# Patient Record
Sex: Female | Born: 1950 | Race: White | Hispanic: No | Smoking: Never smoker
Health system: Southern US, Community
[De-identification: ages and names within clinical notes are randomized; demographics above are authoritative.]

## PROBLEM LIST (undated history)

## (undated) DIAGNOSIS — I10 Essential (primary) hypertension: Secondary | ICD-10-CM

## (undated) DIAGNOSIS — IMO0002 Reserved for concepts with insufficient information to code with codable children: Secondary | ICD-10-CM

## (undated) HISTORY — DX: Reserved for concepts with insufficient information to code with codable children: IMO0002

## (undated) HISTORY — DX: Essential (primary) hypertension: I10

---

## 1999-05-20 ENCOUNTER — Encounter: Admission: RE | Admit: 1999-05-20 | Discharge: 1999-05-20 | Payer: Self-pay | Admitting: Obstetrics and Gynecology

## 1999-05-20 ENCOUNTER — Encounter: Payer: Self-pay | Admitting: Obstetrics and Gynecology

## 2000-05-22 ENCOUNTER — Encounter: Payer: Self-pay | Admitting: Obstetrics and Gynecology

## 2000-05-22 ENCOUNTER — Encounter: Admission: RE | Admit: 2000-05-22 | Discharge: 2000-05-22 | Payer: Self-pay | Admitting: Obstetrics and Gynecology

## 2001-05-27 ENCOUNTER — Encounter: Admission: RE | Admit: 2001-05-27 | Discharge: 2001-05-27 | Payer: Self-pay | Admitting: Obstetrics and Gynecology

## 2001-05-27 ENCOUNTER — Encounter: Payer: Self-pay | Admitting: Obstetrics and Gynecology

## 2002-11-12 ENCOUNTER — Encounter: Admission: RE | Admit: 2002-11-12 | Discharge: 2002-11-12 | Payer: Self-pay | Admitting: Obstetrics and Gynecology

## 2002-11-12 ENCOUNTER — Encounter: Payer: Self-pay | Admitting: Obstetrics and Gynecology

## 2004-01-07 ENCOUNTER — Encounter: Admission: RE | Admit: 2004-01-07 | Discharge: 2004-01-07 | Payer: Self-pay | Admitting: Obstetrics and Gynecology

## 2009-10-15 ENCOUNTER — Ambulatory Visit (HOSPITAL_COMMUNITY): Admission: RE | Admit: 2009-10-15 | Discharge: 2009-10-15 | Payer: Self-pay | Admitting: Obstetrics and Gynecology

## 2010-07-03 ENCOUNTER — Encounter: Payer: Self-pay | Admitting: Obstetrics and Gynecology

## 2012-10-07 ENCOUNTER — Encounter: Payer: Self-pay | Admitting: Gynecology

## 2012-10-16 ENCOUNTER — Encounter: Payer: Self-pay | Admitting: Gynecology

## 2012-10-16 ENCOUNTER — Ambulatory Visit (INDEPENDENT_AMBULATORY_CARE_PROVIDER_SITE_OTHER): Payer: BC Managed Care – PPO | Admitting: Gynecology

## 2012-10-16 VITALS — BP 138/80 | Ht 62.0 in | Wt 146.0 lb

## 2012-10-16 DIAGNOSIS — Z01419 Encounter for gynecological examination (general) (routine) without abnormal findings: Secondary | ICD-10-CM

## 2012-10-16 DIAGNOSIS — N8111 Cystocele, midline: Secondary | ICD-10-CM

## 2012-10-16 NOTE — Progress Notes (Signed)
Kristin Romero 1950/10/17 [id number]        62 y.o.  G3P3003 for annual exam.  Former patient of Dr. Elana Alm.  Past medical history,surgical history, medications, allergies, family history and social history were all reviewed and documented in the EPIC chart. ROS:  Was performed and pertinent positives and negatives are included in the history.  Exam: [name] assistant Filed Vitals:   10/16/12 1135  BP: 138/80  Height: 5\' 2"  (1.575 m)  Weight: 146 lb (66.225 kg)   General appearance  Normal Skin grossly normal Head/Neck normal with no cervical or supraclavicular adenopathy thyroid normal Lungs  clear Cardiac RR, without RMG Abdominal  soft, nontender, without masses, organomegaly or hernia Breasts  examined lying and sitting without masses, retractions, discharge or axillary adenopathy. Pelvic  Ext/BUS/vagina  generalized atrophic changes. Mild cystocele noted  Cervix  normal with atrophic changes  Uterus  axial, normal size, shape and contour, midline and mobile nontender   Adnexa  Without masses or tenderness    Anus and perineum  normal   Rectovaginal  normal sphincter tone without palpated masses or tenderness.    Assessment/Plan:  62 y.o. G44P3003 female for annual exam.   1. Postmenopausal. Without significant symptoms such as hot flushes, night sweats vaginal dryness dyspareunia. We'll continue to monitor. Patient does report any bleeding. 2. Mild cystocele. First degree, asymptomatic. Reviewed with patient. She'll monitor for symptoms but otherwise will follow expectantly. Having no urinary symptoms such as incontinence or urgency. 3. Mammography 09/2012. Continued annual mammography. 4. Pap smear 2012. No Pap smear done today. No history of abnormal Pap smears with regular Pap annual smears historically. Plan repeat next year a 3 year interval. 5. Colonoscopy 4 years ago. Plan repeat next year as she is on the 5 year interval. 6. DEXA never. Plan DEXA age 64 as she is low  risk historically. Increased calcium vitamin D reviewed. 7. Health maintenance. Recently saw Dr. Kevan Ny for her annual and followup of her hypertension. No blood work done as it is all done through his office. Followup one year, sooner as needed.    [name], 12:10 PM 10/16/2012

## 2012-10-16 NOTE — Patient Instructions (Signed)
Follow up in one year for annual exam 

## 2012-10-17 LAB — URINALYSIS W MICROSCOPIC + REFLEX CULTURE
Bacteria, UA: NONE SEEN
Nitrite: NEGATIVE
Protein, ur: NEGATIVE mg/dL
Urobilinogen, UA: 0.2 mg/dL (ref 0.0–1.0)

## 2012-10-18 ENCOUNTER — Other Ambulatory Visit: Payer: Self-pay | Admitting: Gynecology

## 2012-10-18 MED ORDER — SULFAMETHOXAZOLE-TMP DS 800-160 MG PO TABS: 1.0000 | ORAL_TABLET | Freq: Two times a day (BID) | ORAL | Status: DC

## 2012-10-19 LAB — URINE CULTURE

## 2013-05-15 ENCOUNTER — Emergency Department (HOSPITAL_COMMUNITY)
Admission: EM | Admit: 2013-05-15 | Discharge: 2013-05-15 | Disposition: A | Discharge status: Home / Self Care | Payer: BC Managed Care – PPO | Attending: Emergency Medicine | Admitting: Emergency Medicine

## 2013-05-15 ENCOUNTER — Emergency Department (HOSPITAL_COMMUNITY): Payer: BC Managed Care – PPO

## 2013-05-15 ENCOUNTER — Encounter (HOSPITAL_COMMUNITY): Payer: Self-pay | Admitting: Emergency Medicine

## 2013-05-15 DIAGNOSIS — S43016A Anterior dislocation of unspecified humerus, initial encounter: Secondary | ICD-10-CM | POA: Insufficient documentation

## 2013-05-15 DIAGNOSIS — Y929 Unspecified place or not applicable: Secondary | ICD-10-CM | POA: Insufficient documentation

## 2013-05-15 DIAGNOSIS — IMO0002 Reserved for concepts with insufficient information to code with codable children: Secondary | ICD-10-CM | POA: Insufficient documentation

## 2013-05-15 DIAGNOSIS — I10 Essential (primary) hypertension: Secondary | ICD-10-CM | POA: Insufficient documentation

## 2013-05-15 DIAGNOSIS — W010XXA Fall on same level from slipping, tripping and stumbling without subsequent striking against object, initial encounter: Secondary | ICD-10-CM | POA: Insufficient documentation

## 2013-05-15 DIAGNOSIS — S01501A Unspecified open wound of lip, initial encounter: Secondary | ICD-10-CM | POA: Insufficient documentation

## 2013-05-15 DIAGNOSIS — W19XXXA Unspecified fall, initial encounter: Secondary | ICD-10-CM

## 2013-05-15 DIAGNOSIS — K0381 Cracked tooth: Secondary | ICD-10-CM | POA: Insufficient documentation

## 2013-05-15 DIAGNOSIS — Z79899 Other long term (current) drug therapy: Secondary | ICD-10-CM | POA: Insufficient documentation

## 2013-05-15 DIAGNOSIS — S43004A Unspecified dislocation of right shoulder joint, initial encounter: Secondary | ICD-10-CM

## 2013-05-15 DIAGNOSIS — Y9389 Activity, other specified: Secondary | ICD-10-CM | POA: Insufficient documentation

## 2013-05-15 DIAGNOSIS — S43036A Inferior dislocation of unspecified humerus, initial encounter: Secondary | ICD-10-CM | POA: Insufficient documentation

## 2013-05-15 MED ORDER — DIAZEPAM 5 MG/ML IJ SOLN
5.0000 mg | Freq: Once | INTRAMUSCULAR | Status: AC
  Administered 2013-05-15: 5 mg via INTRAVENOUS
  Filled 2013-05-15: qty 2

## 2013-05-15 MED ORDER — FENTANYL CITRATE 0.05 MG/ML IJ SOLN
100.0000 ug | Freq: Once | INTRAMUSCULAR | Status: AC
  Administered 2013-05-15: 100 ug via INTRAVENOUS
  Filled 2013-05-15: qty 2

## 2013-05-15 MED ORDER — HYDROMORPHONE HCL PF 1 MG/ML IJ SOLN
1.0000 mg | Freq: Once | INTRAMUSCULAR | Status: AC
  Administered 2013-05-15: 1 mg via INTRAMUSCULAR
  Filled 2013-05-15: qty 1

## 2013-05-15 MED ORDER — OXYCODONE-ACETAMINOPHEN 5-325 MG PO TABS: 1.0000 | ORAL_TABLET | ORAL | Status: DC | PRN

## 2013-05-15 MED ORDER — SODIUM CHLORIDE 0.9 % IV BOLUS (SEPSIS)
1000.0000 mL | Freq: Once | INTRAVENOUS | Status: AC
  Administered 2013-05-15: 1000 mL via INTRAVENOUS

## 2013-05-15 NOTE — ED Notes (Signed)
PT ambulated with baseline gait; VSS; A&Ox3; no signs of distress; respirations even and unlabored; skin warm and dry; no questions upon discharge.  

## 2013-05-15 NOTE — ED Notes (Signed)
PA manipulating shoulder at bedside.Pt's VSS; no signs of distress; family at bedside.

## 2013-05-15 NOTE — ED Notes (Signed)
PA at bedside.

## 2013-05-15 NOTE — ED Notes (Signed)
MD at bedside. 

## 2013-05-15 NOTE — ED Notes (Signed)
Pain has improved from 10 to 7; pt taken to xray.

## 2013-05-15 NOTE — ED Provider Notes (Signed)
CSN: [id number]     Arrival date & time 05/15/13  1633 History  This chart was scribed for non-physician practitioner Dierdre Forth, PA, working with Raeford Razor, MD by Ronal Fear, ED scribe. This patient was seen in room TR11C/TR11C and the patient's care was started at 6:08PM.    Chief Complaint  Patient presents with  . Fall   (Consider location/radiation/quality/duration/timing/severity/associated sxs/prior Treatment) Patient is a 62 y.o. female presenting with fall. The history is provided by the patient. No language interpreter was used.  Fall This is a new problem. The current episode started 1 to 2 hours ago. The problem occurs rarely. The problem has been gradually improving. Pertinent negatives include no chest pain, no abdominal pain, no headaches and no shortness of breath. Nothing relieves the symptoms. She has tried nothing for the symptoms.  HPI Comments: Kristin Romero is a 62 y.o. female who presents to the Emergency Department complaining of a mechanical sudden fall with 6/10 pain after leaving a store and tripping over her own feet. She reports she fell landing on her right shoulder and then her face.  She has an abrasion to her face and chipped teeth. Pt states that her right arm feels tingly and swollen. Pt denies injury to the head or LOC, neck or back pain. Pt does not appear to be in any acute distress with no other complaints.  She has not attempted any OTC pain medication for the pain.    Past Medical History  Diagnosis Date  . Hypertension    History reviewed. No pertinent past surgical history. Family History  Problem Relation Age of Onset  . Lung cancer Mother   . Colon cancer Father   . Pancreatic cancer Paternal Grandfather    History  Substance Use Topics  . Smoking status: Never Smoker   . Smokeless tobacco: Not on file  . Alcohol Use: 3.0 oz/week    6 drink(s) per week   OB History   Grav Para Term Preterm Abortions TAB SAB Ect Mult  Living   3 3 3       3      Review of Systems  Constitutional: Negative for fever and chills.  Respiratory: Negative for shortness of breath.   Cardiovascular: Negative for chest pain.  Gastrointestinal: Negative for nausea, vomiting and abdominal pain.  Musculoskeletal: Positive for arthralgias, joint swelling and myalgias. Negative for back pain, neck pain and neck stiffness.  Skin: Positive for wound.  Neurological: Negative for syncope, weakness, numbness and headaches.  Hematological: Does not bruise/bleed easily.  Psychiatric/Behavioral: The patient is not nervous/anxious.   All other systems reviewed and are negative.    Allergies  Review of patient's allergies indicates no known allergies.  Home Medications   Current Outpatient Rx  Name  Route  Sig  Dispense  Refill  . amLODipine (NORVASC) 10 MG tablet   Oral   Take 5 mg by mouth daily.         Marland Kitchen escitalopram (LEXAPRO) 10 MG tablet   Oral   Take 5 mg by mouth daily.         . Multiple Vitamins-Minerals (CENTRUM PO)   Oral   Take 1 tablet by mouth daily.         . valsartan-hydrochlorothiazide (DIOVAN-HCT) 80-12.5 MG per tablet   Oral   Take 0.5 tablets by mouth daily.          Marland Kitchen oxyCODONE-acetaminophen (PERCOCET/ROXICET) 5-325 MG per tablet   Oral   Take 1-2  tablets by mouth every 4 (four) hours as needed for severe pain.   15 tablet   0    BP 134/88  Pulse 99  Temp(Src) 97.8 F (36.6 C) (Oral)  Resp 18  SpO2 97% Physical Exam  Nursing note and vitals reviewed. Constitutional: She is oriented to person, place, and time. She appears well-developed and well-nourished. She appears distressed.  Pt crying  HENT:  Head: Normocephalic.  Right Ear: Tympanic membrane and external ear normal.  Left Ear: Tympanic membrane, external ear and ear canal normal.  Nose: Nose normal. No mucosal edema.  Mouth/Throat: Uvula is midline and oropharynx is clear and moist. Lacerations present. No uvula swelling.  No oropharyngeal exudate, posterior oropharyngeal edema, posterior oropharyngeal erythema or tonsillar abscesses.  Small < 1 cm laceration to the mucosal surface of the center upper lip Abrasion to the center upper lip  Eyes: Conjunctivae and EOM are normal. Pupils are equal, round, and reactive to light.  Neck: Normal range of motion. Neck supple.  Cardiovascular: Normal rate, regular rhythm, normal heart sounds and intact distal pulses.   No murmur heard. Capillary refill less than 3 seconds  Pulmonary/Chest: Effort normal and breath sounds normal. No respiratory distress. She has no wheezes.  Abdominal: Soft. Bowel sounds are normal. She exhibits no distension.  Musculoskeletal: She exhibits tenderness. She exhibits no edema.       Right shoulder: She exhibits decreased range of motion, tenderness, swelling, deformity, pain, spasm and decreased strength (isolated to the right shoulder). She exhibits no bony tenderness, no effusion, no crepitus, no laceration and normal pulse.       Right elbow: She exhibits normal range of motion, no swelling, no effusion, no deformity and no laceration. No tenderness found.       Right wrist: Normal. She exhibits normal range of motion, no tenderness, no bony tenderness, no swelling, no effusion, no crepitus, no deformity and no laceration.       Right hand: She exhibits normal range of motion, no tenderness, no bony tenderness, normal two-point discrimination, normal capillary refill, no deformity, no laceration and no swelling. Normal sensation noted. Normal strength noted.  ROM: decreased ROM of the right shoulder 2/2 palpable dislocation; Full ROM of all other joints on the RUE and all other major joints  Lymphadenopathy:    She has no cervical adenopathy.  Neurological: She is alert and oriented to person, place, and time. No cranial nerve deficit. She exhibits normal muscle tone. Coordination normal.  Speech is clear and goal oriented, follows  commands Major Cranial nerves without deficit, no facial droop Normal strength in left upper and bilateral lower extremities including dorsiflexion and plantar flexion, strong and equal grip strength Sensation normal to light and sharp touch in all extremities Moves extremities without ataxia, coordination intact    Skin: Skin is warm and dry. No rash noted. She is not diaphoretic. No erythema.  No tenting of the skin Small abrasions to the left knee, left ankle and dorsum of the left foot  Psychiatric: She has a normal mood and affect.    ED Course  Reduction of dislocation Date/Time: 05/15/2013 7:12 PM Performed by: Dierdre Forth Authorized by: Dierdre Forth Consent: Verbal consent obtained. Risks and benefits: risks, benefits and alternatives were discussed Consent given by: patient Patient understanding: patient states understanding of the procedure being performed Patient consent: the patient's understanding of the procedure matches consent given Procedure consent: procedure consent matches procedure scheduled Relevant documents: relevant documents present and verified Site  marked: the operative site was marked Imaging studies: imaging studies available Required items: required blood products, implants, devices, and special equipment available Patient identity confirmed: arm band and verbally with patient Time out: Immediately prior to procedure a "time out" was called to verify the correct patient, procedure, equipment, support staff and site/side marked as required. Preparation: Patient was prepped and draped in the usual sterile fashion. Local anesthesia used: no Patient sedated: yes Sedation type: anxiolysis Sedatives: diazepam Analgesia: fentanyl Sedation start date/time: 05/15/2013 6:45 PM Sedation end date/time: 05/15/2013 7:12 PM Vitals: Vital signs were monitored during sedation. Patient tolerance: Patient tolerated the procedure well with no immediate  complications. Comments: Right shoulder reduced without complication and with full ROM afterward   (including critical care time)  DIAGNOSTIC STUDIES: Oxygen Saturation is 96% on RA, normal by my interpretation.    COORDINATION OF CARE: 6:13 PM- Pt advised of plan for treatment including resetting the shoulder and conscious sedation and pt agrees.  Labs Review Labs Reviewed - No data to display Imaging Review Dg Shoulder Right  05/15/2013   CLINICAL DATA:  Fall.  Right shoulder injury and pain.  EXAM: RIGHT SHOULDER - 2+ VIEW  COMPARISON:  None.  FINDINGS: Anterior inferior dislocation of the humeral head is seen. No evidence of fracture. Mild degenerative spurring of the acromion process and distal clavicle noted.  IMPRESSION: Anterior-inferior dislocation of the shoulder. No fracture identified.   Electronically Signed   By: Myles Rosenthal M.D.   On: 05/15/2013 18:01   Dg Shoulder Right Port  05/15/2013   CLINICAL DATA:  History of dislocation status post reduction.  EXAM: PORTABLE RIGHT SHOULDER - 2+ VIEW  COMPARISON:  None.  FINDINGS: The previously noted dislocation has been reduced. There is no dislocation on identified on the current films. There is no fracture. The visualized right ribs and right lung are normal.  IMPRESSION: Previously noted dislocation has been reduced. There is no fracture.   Electronically Signed   By: Sherian Rein M.D.   On: 05/15/2013 19:47   Dg Humerus Right  05/15/2013   CLINICAL DATA:  Fall.  Right humerus injury and pain.  EXAM: RIGHT HUMERUS - 2+ VIEW  COMPARISON:  None.  FINDINGS: No evidence of humerus fracture. Anterior -inferior dislocation of the right shoulder is seen.  IMPRESSION: Anterioranterior-inferior dislocation of the right shoulder joint. No humerus fracture identified.   Electronically Signed   By: Myles Rosenthal M.D.   On: 05/15/2013 18:00    EKG Interpretation   None       MDM   1. Fall, initial encounter   2. Shoulder dislocation,  right, initial encounter      Kristin Romero presents with right shoulder dislocation after mechanical fall.  I personally reviewed the imaging tests through PACS system.  I reviewed available ER/hospitalization records through the EMR.  Pt is not taking an anticoagulant.  Will give mild sedation and attempt reduction. Pt is neurovascularly intact and hemodynamically stable. Patient without LOC.  Doubt subdural hematoma.  Patient without neck or back pain, full range of motion of the neck without difficulty, no midline tenderness and no step-offs or deformities; highly doubt cervical spine fracture.      7:20 PM Pt given pain control and and anxiolytics prior to the procedure and shoulder was relocated without complication and with significant pain relief.  Pt is neurovascularly intact after the reduction.  She has full range of motion of the right shoulder after reduction. She is hemodynamically  stable and ambulates without difficulty.  Right shoulder placed in a sling and patient discharged in stable condition.  It has been determined that no acute conditions requiring further emergency intervention are present at this time. The patient/guardian have been advised of the diagnosis and plan. We have discussed signs and symptoms that warrant return to the ED, such as changes or worsening in symptoms.   Vital signs are stable at discharge.   BP 134/88  Pulse 99  Temp(Src) 97.8 F (36.6 C) (Oral)  Resp 18  SpO2 97%  Patient/guardian has voiced understanding and agreed to follow-up with the PCP or specialist.      Dierdre Forth, PA-C 05/15/13 2026

## 2013-05-15 NOTE — ED Notes (Signed)
Pt reports her shoulder feels a lot better.

## 2013-05-15 NOTE — ED Notes (Signed)
Pt tripped and fell onto R arm and mouth just pta. C/o severe upper arm pain now. Abrasion to top lip with no active bleeding, no loose teeth but front teeth are chipped. Pt denies LOC. Ambulatory, A&Ox4

## 2013-05-21 NOTE — ED Provider Notes (Signed)
Medical screening examination/treatment/procedure(s) were conducted as a shared visit with non-physician practitioner(s) and myself.  I personally evaluated the patient during the encounter.  EKG Interpretation   None      62 year old female with right shoulder pain after a fall. Imaging significant for an anterior shoulder dislocation. Reduced by Dahlia Client after anxiolysis and dose of opiate. Symptoms much improved. Postreduction films confirm reduction. Sling, PRN pain meds and ortho FU. Additionally, facial abrasions and chipped incisor. Wound care and dental fu.   Raeford Razor, MD 05/21/13 [phone]

## 2014-03-02 DIAGNOSIS — H52229 Regular astigmatism, unspecified eye: Secondary | ICD-10-CM | POA: Diagnosis not present

## 2014-03-02 DIAGNOSIS — H2510 Age-related nuclear cataract, unspecified eye: Secondary | ICD-10-CM | POA: Diagnosis not present

## 2014-03-02 DIAGNOSIS — H5210 Myopia, unspecified eye: Secondary | ICD-10-CM | POA: Diagnosis not present

## 2014-04-13 ENCOUNTER — Encounter (HOSPITAL_COMMUNITY): Payer: Self-pay | Admitting: Emergency Medicine

## 2014-09-11 DIAGNOSIS — IMO0002 Reserved for concepts with insufficient information to code with codable children: Secondary | ICD-10-CM

## 2014-09-11 HISTORY — DX: Reserved for concepts with insufficient information to code with codable children: IMO0002

## 2014-09-17 DIAGNOSIS — Z1231 Encounter for screening mammogram for malignant neoplasm of breast: Secondary | ICD-10-CM | POA: Diagnosis not present

## 2014-09-21 ENCOUNTER — Encounter: Payer: Self-pay | Admitting: Gynecology

## 2014-09-24 ENCOUNTER — Encounter: Payer: Self-pay | Admitting: Gynecology

## 2014-09-24 ENCOUNTER — Other Ambulatory Visit (HOSPITAL_COMMUNITY)
Admission: RE | Admit: 2014-09-24 | Discharge: 2014-09-24 | Disposition: A | Discharge status: Home / Self Care | Payer: Medicare Other | Source: Ambulatory Visit | Attending: Gynecology | Admitting: Gynecology

## 2014-09-24 ENCOUNTER — Ambulatory Visit (INDEPENDENT_AMBULATORY_CARE_PROVIDER_SITE_OTHER): Payer: Medicare Other | Admitting: Gynecology

## 2014-09-24 VITALS — BP 122/76 | Ht 62.0 in | Wt 153.0 lb

## 2014-09-24 DIAGNOSIS — Z01419 Encounter for gynecological examination (general) (routine) without abnormal findings: Secondary | ICD-10-CM | POA: Diagnosis not present

## 2014-09-24 DIAGNOSIS — R87619 Unspecified abnormal cytological findings in specimens from cervix uteri: Secondary | ICD-10-CM | POA: Insufficient documentation

## 2014-09-24 DIAGNOSIS — N952 Postmenopausal atrophic vaginitis: Secondary | ICD-10-CM

## 2014-09-24 DIAGNOSIS — Z1151 Encounter for screening for human papillomavirus (HPV): Secondary | ICD-10-CM | POA: Diagnosis not present

## 2014-09-24 DIAGNOSIS — Z124 Encounter for screening for malignant neoplasm of cervix: Secondary | ICD-10-CM | POA: Diagnosis not present

## 2014-09-24 NOTE — Addendum Note (Signed)
Addended by: Nelva Nay on: 09/24/2014 12:01 PM   Modules accepted: Orders

## 2014-09-24 NOTE — Patient Instructions (Signed)
Follow up for bone density as scheduled.  You may obtain a copy of any labs that were done today by logging onto MyChart as outlined in the instructions provided with your AVS (after visit summary). The office will not call with normal lab results but certainly if there are any significant abnormalities then we will contact you.   Health Maintenance, Female A healthy lifestyle and preventative care can promote health and wellness.  Maintain regular health, dental, and eye exams.  Eat a healthy diet. Foods like vegetables, fruits, whole grains, low-fat dairy products, and lean protein foods contain the nutrients you need without too many calories. Decrease your intake of foods high in solid fats, added sugars, and salt. Get information about a proper diet from your caregiver, if necessary.  Regular physical exercise is one of the most important things you can do for your health. Most adults should get at least 150 minutes of moderate-intensity exercise (any activity that increases your heart rate and causes you to sweat) each week. In addition, most adults need muscle-strengthening exercises on 2 or more days a week.   Maintain a healthy weight. The body mass index (BMI) is a screening tool to identify possible weight problems. It provides an estimate of body fat based on height and weight. Your caregiver can help determine your BMI, and can help you achieve or maintain a healthy weight. For adults 20 years and older:  A BMI below 18.5 is considered underweight.  A BMI of 18.5 to 24.9 is normal.  A BMI of 25 to 29.9 is considered overweight.  A BMI of 30 and above is considered obese.  Maintain normal blood lipids and cholesterol by exercising and minimizing your intake of saturated fat. Eat a balanced diet with plenty of fruits and vegetables. Blood tests for lipids and cholesterol should begin at age 20 and be repeated every 5 years. If your lipid or cholesterol levels are high, you are over  50, or you are a high risk for heart disease, you may need your cholesterol levels checked more frequently.Ongoing high lipid and cholesterol levels should be treated with medicines if diet and exercise are not effective.  If you smoke, find out from your caregiver how to quit. If you do not use tobacco, do not start.  Lung cancer screening is recommended for adults aged 55 80 years who are at high risk for developing lung cancer because of a history of smoking. Yearly low-dose computed tomography (CT) is recommended for people who have at least a 30-pack-year history of smoking and are a current smoker or have quit within the past 15 years. A pack year of smoking is smoking an average of 1 pack of cigarettes a day for 1 year (for example: 1 pack a day for 30 years or 2 packs a day for 15 years). Yearly screening should continue until the smoker has stopped smoking for at least 15 years. Yearly screening should also be stopped for people who develop a health problem that would prevent them from having lung cancer treatment.  If you are pregnant, do not drink alcohol. If you are breastfeeding, be very cautious about drinking alcohol. If you are not pregnant and choose to drink alcohol, do not exceed 1 drink per day. One drink is considered to be 12 ounces (355 mL) of beer, 5 ounces (148 mL) of wine, or 1.5 ounces (44 mL) of liquor.  Avoid use of street drugs. Do not share needles with anyone. Ask for help   if you need support or instructions about stopping the use of drugs.  High blood pressure causes heart disease and increases the risk of stroke. Blood pressure should be checked at least every 1 to 2 years. Ongoing high blood pressure should be treated with medicines, if weight loss and exercise are not effective.  If you are 55 to 66 years old, ask your caregiver if you should take aspirin to prevent strokes.  Diabetes screening involves taking a blood sample to check your fasting blood sugar level.  This should be done once every 3 years, after age 45, if you are within normal weight and without risk factors for diabetes. Testing should be considered at a younger age or be carried out more frequently if you are overweight and have at least 1 risk factor for diabetes.  Breast cancer screening is essential preventative care for women. You should practice "breast self-awareness." This means understanding the normal appearance and feel of your breasts and may include breast self-examination. Any changes detected, no matter how small, should be reported to a caregiver. Women in their 20s and 30s should have a clinical breast exam (CBE) by a caregiver as part of a regular health exam every 1 to 3 years. After age 40, women should have a CBE every year. Starting at age 40, women should consider having a mammogram (breast X-ray) every year. Women who have a family history of breast cancer should talk to their caregiver about genetic screening. Women at a high risk of breast cancer should talk to their caregiver about having an MRI and a mammogram every year.  Breast cancer gene (BRCA)-related cancer risk assessment is recommended for women who have family members with BRCA-related cancers. BRCA-related cancers include breast, ovarian, tubal, and peritoneal cancers. Having family members with these cancers may be associated with an increased risk for harmful changes (mutations) in the breast cancer genes BRCA1 and BRCA2. Results of the assessment will determine the need for genetic counseling and BRCA1 and BRCA2 testing.  The Pap test is a screening test for cervical cancer. Women should have a Pap test starting at age 21. Between ages 21 and 29, Pap tests should be repeated every 2 years. Beginning at age 30, you should have a Pap test every 3 years as long as the past 3 Pap tests have been normal. If you had a hysterectomy for a problem that was not cancer or a condition that could lead to cancer, then you no  longer need Pap tests. If you are between ages 65 and 70, and you have had normal Pap tests going back 10 years, you no longer need Pap tests. If you have had past treatment for cervical cancer or a condition that could lead to cancer, you need Pap tests and screening for cancer for at least 20 years after your treatment. If Pap tests have been discontinued, risk factors (such as a new sexual partner) need to be reassessed to determine if screening should be resumed. Some women have medical problems that increase the chance of getting cervical cancer. In these cases, your caregiver may recommend more frequent screening and Pap tests.  The human papillomavirus (HPV) test is an additional test that may be used for cervical cancer screening. The HPV test looks for the virus that can cause the cell changes on the cervix. The cells collected during the Pap test can be tested for HPV. The HPV test could be used to screen women aged 30 years and older, and   should be used in women of any age who have unclear Pap test results. After the age of 30, women should have HPV testing at the same frequency as a Pap test.  Colorectal cancer can be detected and often prevented. Most routine colorectal cancer screening begins at the age of 50 and continues through age 75. However, your caregiver may recommend screening at an earlier age if you have risk factors for colon cancer. On a yearly basis, your caregiver may provide home test kits to check for hidden blood in the stool. Use of a small camera at the end of a tube, to directly examine the colon (sigmoidoscopy or colonoscopy), can detect the earliest forms of colorectal cancer. Talk to your caregiver about this at age 50, when routine screening begins. Direct examination of the colon should be repeated every 5 to 10 years through age 75, unless early forms of pre-cancerous polyps or small growths are found.  Hepatitis C blood testing is recommended for all people born from  1945 through 1965 and any individual with known risks for hepatitis C.  Practice safe sex. Use condoms and avoid high-risk sexual practices to reduce the spread of sexually transmitted infections (STIs). Sexually active women aged 25 and younger should be checked for Chlamydia, which is a common sexually transmitted infection. Older women with new or multiple partners should also be tested for Chlamydia. Testing for other STIs is recommended if you are sexually active and at increased risk.  Osteoporosis is a disease in which the bones lose minerals and strength with aging. This can result in serious bone fractures. The risk of osteoporosis can be identified using a bone density scan. Women ages 65 and over and women at risk for fractures or osteoporosis should discuss screening with their caregivers. Ask your caregiver whether you should be taking a calcium supplement or vitamin D to reduce the rate of osteoporosis.  Menopause can be associated with physical symptoms and risks. Hormone replacement therapy is available to decrease symptoms and risks. You should talk to your caregiver about whether hormone replacement therapy is right for you.  Use sunscreen. Apply sunscreen liberally and repeatedly throughout the day. You should seek shade when your shadow is shorter than you. Protect yourself by wearing long sleeves, pants, a wide-brimmed hat, and sunglasses year round, whenever you are outdoors.  Notify your caregiver of new moles or changes in moles, especially if there is a change in shape or color. Also notify your caregiver if a mole is larger than the size of a pencil eraser.  Stay current with your immunizations. Document Released: 12/12/2010 Document Revised: 09/23/2012 Document Reviewed: 12/12/2010 [employer] Patient Information 2014 [employer].   

## 2014-09-24 NOTE — Progress Notes (Signed)
Kristin Romero [birth year] [id number]        64 y.o.  [id number] for breast and pelvic exam.  Past medical history,surgical history, problem list, medications, allergies, family history and social history were all reviewed and documented as reviewed in the EPIC chart.  ROS:  Performed with pertinent positives and negatives included in the history, assessment and plan.   Additional significant findings :  none   Exam: Kristin Romero Vitals:   09/24/14 1124  BP: 122/76  Height: 5\' 2"  (1.575 m)  Weight: 153 lb (69.4 kg)   General appearance:  Normal affect, orientation and appearance. Skin: Grossly normal HEENT: Without gross lesions.  No cervical or supraclavicular adenopathy. Thyroid normal.  Lungs:  Clear without wheezing, rales or rhonchi Cardiac: RR, without RMG Abdominal:  Soft, nontender, without masses, guarding, rebound, organomegaly or hernia Breasts:  Examined lying and sitting without masses, retractions, discharge or axillary adenopathy. Pelvic:  Ext/BUS/vagina with atrophic changes  Cervix with atrophic changes. Pap smear done  Uterus anteverted, normal size, shape and contour, midline and mobile nontender   Adnexa  Without masses or tenderness    Anus and perineum  Normal   Rectovaginal  Normal sphincter tone without palpated masses or tenderness.    Assessment/Plan:  64 y.o. [id number] female for breast and pelvic exam.   1. Postmenopausal. Without significant hot flushes, night sweats, vaginal dryness or any vaginal bleeding. Continue to monitor and report any vaginal bleeding or other issues. 2. Pap smear 2012. Pap smear done today. No history of abnormal Pap smears previously. 3. Colonoscopy 2015. Repeat at their recommended interval. 4. Mammography 09/2014. Continue with annual mammography. SBE monthly reviewed. 5. DEXA never. Schedule baseline DEXA now she is 29. Increased calcium vitamin D reviewed. 6. Health maintenance. No routine blood work done as this  is done at her primary physician's office. Follow up for bone density otherwise 1 year, sooner if any issues.     Kristin Auerbach MD, 11:54 AM 09/24/2014

## 2014-09-25 LAB — URINALYSIS W MICROSCOPIC + REFLEX CULTURE
Bilirubin Urine: NEGATIVE
CASTS: NONE SEEN
Crystals: NONE SEEN
Glucose, UA: NEGATIVE mg/dL
Ketones, ur: NEGATIVE mg/dL
LEUKOCYTES UA: NEGATIVE
Nitrite: NEGATIVE
Protein, ur: NEGATIVE mg/dL
Specific Gravity, Urine: 1.025 (ref 1.005–1.030)
Urobilinogen, UA: 0.2 mg/dL (ref 0.0–1.0)
pH: 6 (ref 5.0–8.0)

## 2014-09-25 LAB — CYTOLOGY - PAP

## 2014-09-30 ENCOUNTER — Encounter: Payer: Self-pay | Admitting: Gynecology

## 2015-03-30 DIAGNOSIS — H5213 Myopia, bilateral: Secondary | ICD-10-CM | POA: Diagnosis not present

## 2015-03-30 DIAGNOSIS — H2513 Age-related nuclear cataract, bilateral: Secondary | ICD-10-CM | POA: Diagnosis not present

## 2015-03-30 DIAGNOSIS — H52223 Regular astigmatism, bilateral: Secondary | ICD-10-CM | POA: Diagnosis not present

## 2015-07-22 ENCOUNTER — Ambulatory Visit (INDEPENDENT_AMBULATORY_CARE_PROVIDER_SITE_OTHER): Payer: Medicare Other

## 2015-07-22 ENCOUNTER — Encounter: Payer: Self-pay | Admitting: Sports Medicine

## 2015-07-22 ENCOUNTER — Ambulatory Visit (INDEPENDENT_AMBULATORY_CARE_PROVIDER_SITE_OTHER): Payer: Medicare Other | Admitting: Sports Medicine

## 2015-07-22 VITALS — BP 166/104 | HR 91 | Ht 62.0 in | Wt 156.0 lb

## 2015-07-22 DIAGNOSIS — X58XXXA Exposure to other specified factors, initial encounter: Secondary | ICD-10-CM

## 2015-07-22 DIAGNOSIS — S43014A Anterior dislocation of right humerus, initial encounter: Secondary | ICD-10-CM | POA: Diagnosis not present

## 2015-07-22 DIAGNOSIS — M25511 Pain in right shoulder: Secondary | ICD-10-CM

## 2015-07-22 DIAGNOSIS — S43004A Unspecified dislocation of right shoulder joint, initial encounter: Secondary | ICD-10-CM | POA: Diagnosis not present

## 2015-07-22 DIAGNOSIS — S43081A Other subluxation of right shoulder joint, initial encounter: Secondary | ICD-10-CM | POA: Diagnosis not present

## 2015-07-22 DIAGNOSIS — S43084A Other dislocation of right shoulder joint, initial encounter: Secondary | ICD-10-CM | POA: Diagnosis not present

## 2015-07-22 MED ORDER — HYDROCODONE-ACETAMINOPHEN 5-325 MG PO TABS: 1.0000 | ORAL_TABLET | Freq: Three times a day (TID) | ORAL | Status: DC | PRN

## 2015-07-22 NOTE — Progress Notes (Signed)
   Subjective:    I'm seeing this patient as a consultation for:   Dr. Inda Merlin  CC:  Right shoulder dislocation  HPI: This is a pleasant 65 year old female, she has a history of a right shoulder dislocation 3 years ago, this was reduced in the emergency department, she never had any further follow-up, physical therapy, or advanced imaging. Today she slipped and fell, injuring her right shoulder and she feels as though it is recurrently dislocated  Past medical history, Surgical history, Family history not pertinant except as noted below, Social history, Allergies, and medications have been entered into the medical record, reviewed, and no changes needed.   Review of Systems: No headache, visual changes, nausea, vomiting, diarrhea, constipation, dizziness, abdominal pain, skin rash, fevers, chills, night sweats, weight loss, swollen lymph nodes, body aches, joint swelling, muscle aches, chest pain, shortness of breath, mood changes, visual or auditory hallucinations.   Objective:   General: Well Developed, well nourished, and in no acute distress.  Neuro/Psych: Alert and oriented x3, extra-ocular muscles intact, able to move all 4 extremities, sensation grossly intact. Skin: Warm and dry, no rashes noted.  Respiratory: Not using accessory muscles, speaking in full sentences, trachea midline.  Cardiovascular: Pulses palpable, no extremity edema. Abdomen: Does not appear distended. Right shoulder: Held adducted and internally rotated, good sensation in the axillary distribution, good radial and ulnar artery pulses. No wrist drop.  Procedure:  Right glenohumeral dislocation reduction  Risks, benefits, and alternatives explained and consent obtained. Time out conducted. Surface prepped with alcohol. Using a spinal needle advanced into the glenohumeral capsule, I injected 5 mL lidocaine, 5 mL Marcaine. Adequate anesthesia ensured. Reduction: Using gentle axial traction and slow abduction and  external rotation of his able to reduce the humeral head into the glenoid socket. Good sensation and pulses post reduction. Post reduction films obtained showed anatomic alignment. Pt stable, aftercare and follow-up advised.  Impression and Recommendations:   This case required medical decision making of moderate complexity.

## 2015-07-22 NOTE — Assessment & Plan Note (Signed)
Closed reduction as above. Sling,  Hydrocodone, we will start formal physical therapy in a month. This is her second dislocation, ultimately she will need an MR arthrogram

## 2015-07-23 ENCOUNTER — Ambulatory Visit (INDEPENDENT_AMBULATORY_CARE_PROVIDER_SITE_OTHER): Payer: Medicare Other | Admitting: Sports Medicine

## 2015-07-23 VITALS — BP 166/96 | HR 106 | Resp 18 | Wt 156.0 lb

## 2015-07-23 DIAGNOSIS — S43004A Unspecified dislocation of right shoulder joint, initial encounter: Secondary | ICD-10-CM

## 2015-07-23 NOTE — Progress Notes (Signed)
Shoulder immobilizer placed on right shoulder. CSM's checked and instructions given to patient. No question or concerns at this time. Leta, LPN

## 2015-07-29 ENCOUNTER — Ambulatory Visit (INDEPENDENT_AMBULATORY_CARE_PROVIDER_SITE_OTHER): Payer: Medicare Other | Admitting: Physical Therapy

## 2015-07-29 ENCOUNTER — Encounter: Payer: Self-pay | Admitting: Physical Therapy

## 2015-07-29 DIAGNOSIS — R293 Abnormal posture: Secondary | ICD-10-CM | POA: Diagnosis not present

## 2015-07-29 DIAGNOSIS — R29898 Other symptoms and signs involving the musculoskeletal system: Secondary | ICD-10-CM

## 2015-07-29 NOTE — Patient Instructions (Signed)
ROM: Pendulum (Circular)    Let right arm move in circle clockwise, then counterclockwise, by rocking body weight in circular pattern. Circle _5-10___ times each direction per set. Do __as needed for shoulder tightness__ sets per session.    ROM: Flexion (Alternate)    Slide right arm up wall, with palm out, by leaning toward wall. Hold _1-2__ seconds. Repeat _10___ times per set. Do _1___ sets per session. Do _1-2___ sessions per day.  Strengthening: Isometric Flexion    Using wall for resistance, press right fist into ball using light pressure. Hold __5__ seconds. Repeat _10___ times per set. Do _1___ sets per session. Do _1-2___ sessions per day.  Strengthening: Isometric Extension    Using wall for resistance, press back of left arm into ball using light pressure. Hold __5__ seconds. Repeat _10___ times per set. Do _1___ sets per session. Do __1-2__ sessions per day.  Strengthening: Isometric Abduction    Using wall for resistance, press left arm into ball using light pressure. Hold __5__ seconds. Repeat _10___ times per set. Do __1__ sets per session. Do __1-2__ sessions per day.   Strengthening: Isometric Internal Rotation    Using door frame for resistance, press palm of right hand into ball using light pressure. Keep elbow in at side. Hold _5___ seconds. Repeat _10___ times per set. Do __1__ sets per session. Do _1-2___ sessions per day.

## 2015-07-29 NOTE — Therapy (Signed)
[facility] [address] [place], [place], [address] Phone: [phone]   Fax:  [phone]  Physical Therapy Evaluation  Patient Details  Name: Kristin Romero MRN: [id number] Date of Birth: [birth year] Referring Provider: Dr Dianah Field  Encounter Date: 07/29/2015      PT End of Session - 07/29/15 1153    Visit Number 1   Number of Visits 8   Date for PT Re-Evaluation 08/26/15   PT Start Time [id number]   PT Stop Time 1232   PT Time Calculation (min) 39 min   Activity Tolerance Patient tolerated treatment well      Past Medical History  Diagnosis Date  . Hypertension   . ASCUS favor benign 09/2014    negative high risk HPV recommend repeat Pap smear one year    History reviewed. No pertinent past surgical history.  There were no vitals filed for this visit.  Visit Diagnosis:  Weakness of shoulder - Plan: PT plan of care cert/re-cert  Abnormal posture - Plan: PT plan of care cert/re-cert      Subjective Assessment - 07/29/15 1153    Subjective Pt fell on 07/22/15 in her daughters kitchen after shoe lace caught on a knob and tripped her, she dislocated her Rt shoulder, she had this happen about 3 yrs ago also.    Diagnostic tests x-rays (-) fx   Patient Stated Goals help take care of 10 grandchildren and lift the small ones. Fix hair    Currently in Pain? No/denies            [facility] PT Assessment - 07/29/15 0001    Assessment   Medical Diagnosis Rt shoulder dislocation   Referring Provider Dr Dianah Field   Onset Date/Surgical Date 07/22/15   Hand Dominance Right   Next MD Visit 3 wks   Prior Therapy none   Precautions   Precautions None   Required Braces or Orthoses Sling  Rt UE not sure how long.    Balance Screen   Has the patient fallen in the past 6 months Yes   How many times? 1   Has the patient had a decrease in activity level because of a fear of falling?  No   Is the patient reluctant  to leave their home because of a fear of falling?  No   Prior Function   Level of Independence Independent   Vocation Retired   Leisure play with grandchildren, travel    Observation/Other Assessments   Focus on Therapeutic Outcomes (FOTO)  44% limited   Posture/Postural Control   Posture/Postural Control Postural limitations   Postural Limitations Rounded Shoulders;Forward head;Increased thoracic kyphosis   ROM / Strength   AROM / PROM / Strength AROM;PROM;Strength   AROM   AROM Assessment Site Shoulder;Cervical;Elbow   Right/Left Shoulder Right  Lt WNL   Right Shoulder Extension 85 Degrees   Right Shoulder Flexion 170 Degrees  no pain   Right Shoulder ABduction 168 Degrees   Right Shoulder Internal Rotation 65 Degrees  arm 45 degrees abduction    Right Shoulder External Rotation 60 Degrees  with 45 degrees abduction    Right/Left Elbow --  bilat WNL   Cervical Flexion WNL   Cervical Extension WNL   Cervical - Right Side Bend WNL   Cervical - Left Side Bend WNL   Cervical - Right Rotation WNL   Cervical - Left Rotation WNL   Strength   Overall Strength Comments mid traps 4/5   Strength  Assessment Site Shoulder;Elbow   Right/Left Shoulder Right  WNL Lt, Rt tested with arm at side.    Right Shoulder Flexion 4+/5   Right Shoulder Extension 4+/5   Right Shoulder ABduction 4+/5   Right Shoulder Internal Rotation 5/5   Right Shoulder External Rotation 4+/5   Right/Left Elbow --  bilat WNL   Palpation   Palpation comment some trigger points in Rt deltoid                    [facility] Adult PT Treatment/Exercise - 07/29/15 0001    Exercises   Exercises Shoulder   Shoulder Exercises: Isometric Strengthening   Flexion --  10x5sec   Extension --  10x5sec   External Rotation --  10x5sec   Internal Rotation --  10x5sec   ABduction --  10x5sec   Shoulder Exercises: Stretch   Other Shoulder Stretches wall climbs Rt for flexion   Other Shoulder Stretches  pendulum                PT Education - 07/29/15 1229    Education provided Yes   Education Details HEP isometrics, wall climbs and posture   Person(s) Educated Patient   Methods Explanation;Demonstration;Handout   Comprehension Verbalized understanding;Returned demonstration             PT Long Term Goals - 07/29/15 1300    PT LONG TERM GOAL #1   Title I with advanced HEP ( 08/26/15)    Time 4   Period Weeks   Status New   PT LONG TERM GOAL #2   Title place items on top shelf without difficulty ( 08/26/15)    Time 4   Period Weeks   Status New   PT LONG TERM GOAL #3   Title demo Rt shoulder strength =/> 5-/5 without pain ( 08/26/15)    Time 4   Period Weeks   Status New   PT LONG TERM GOAL #4   Title demo upper back strength =/> 5-/5 ( 08/26/15)    Time 4   Period Weeks   Status New   PT LONG TERM GOAL #5   Title improve FOTO =/> 28% limited, CJ level ( 08/26/15)    Time 4   Period Weeks   Status New               Plan - 07/29/15 1258    Clinical Impression Statement 65 yo female one week s/p Rt shoulder dislocation, she presents painfree and has been wearing her brace when she is up.  She has shoulder weakness and postural changes.  Her ROM is [facility] however she is fearful of using the arm.     Pt will benefit from skilled therapeutic intervention in order to improve on the following deficits Postural dysfunction;Decreased strength   Rehab Potential Excellent   PT Frequency 2x / week   PT Duration 4 weeks   PT Treatment/Interventions Ultrasound;Neuromuscular re-education;Patient/family education;Dry needling;Electrical Stimulation;Cryotherapy;Moist Heat;Therapeutic exercise;Manual techniques   PT Next Visit Plan Pt will be out of town next week, will return the following week. progress shoulder strenghtening and upper back.          Problem List Patient Active Problem List   Diagnosis Date Noted  . Dislocation of shoulder, right, closed  07/22/2015    Jeral Pinch PT 07/29/2015, 1:08 PM  [facility] [address] [place], [place], [address] Phone: [phone]   Fax:  [phone]  Name: Kristin  [name] MRN: [id number] Date of Birth: [birth year]

## 2015-08-10 DIAGNOSIS — I10 Essential (primary) hypertension: Secondary | ICD-10-CM | POA: Diagnosis not present

## 2015-08-10 DIAGNOSIS — F439 Reaction to severe stress, unspecified: Secondary | ICD-10-CM | POA: Diagnosis not present

## 2015-08-11 ENCOUNTER — Encounter: Payer: Self-pay | Admitting: Physical Therapy

## 2015-08-11 ENCOUNTER — Ambulatory Visit (INDEPENDENT_AMBULATORY_CARE_PROVIDER_SITE_OTHER): Payer: Medicare Other | Admitting: Physical Therapy

## 2015-08-11 DIAGNOSIS — R29898 Other symptoms and signs involving the musculoskeletal system: Secondary | ICD-10-CM

## 2015-08-11 DIAGNOSIS — R293 Abnormal posture: Secondary | ICD-10-CM | POA: Diagnosis not present

## 2015-08-11 NOTE — Patient Instructions (Signed)
Over Head Pull: Narrow Grip     [place] [phone]   On back, knees bent, feet flat, band across thighs, elbows straight but relaxed. Pull hands apart (start). Keeping elbows straight, bring arms up and over head, hands toward floor. Keep pull steady on band. Hold momentarily. Return slowly, keeping pull steady, back to start. Repeat _3x10__ times. Band color __red____   Side Pull: Double Arm   On back, knees bent, feet flat. Arms perpendicular to body, shoulder level, elbows straight but relaxed. Pull arms out to sides, elbows straight. Resistance band comes across collarbones, hands toward floor. Hold momentarily. Slowly return to starting position. Repeat _3x10 __ times. Band color __red___   Elmer Picker   On back, knees bent, feet flat, left hand on left hip, right hand above left. Pull right arm DIAGONALLY (hip to shoulder) across chest. Bring right arm along head toward floor. Hold momentarily. Slowly return to starting position. Repeat 3x10___ times. Do with left arm. Band color __red____   Shoulder Rotation: Double Arm   On back, knees bent, feet flat, elbows tucked at sides, bent 90, hands palms up. Pull hands apart and down toward floor, keeping elbows near sides. Hold momentarily. Slowly return to starting position. Repeat _3x10__ times. Band color __red____

## 2015-08-11 NOTE — Therapy (Signed)
[place], [place], [address] Phone: [phone]   Fax:  [phone]  Physical Therapy Treatment  Patient Details  Name: Kristin Romero MRN: [id number] Date of Birth: 01/09/1951 Referring Provider: Dr Dianah Field  Encounter Date: 08/11/2015      PT End of Session - 08/11/15 1111    Visit Number 2   Number of Visits 8   Date for PT Re-Evaluation 08/26/15   PT Start Time 1109   PT Stop Time (p) 1150   PT Time Calculation (min) (p) 41 min      Past Medical History  Diagnosis Date  . Hypertension   . ASCUS favor benign 09/2014    negative high risk HPV recommend repeat Pap smear one year    History reviewed. No pertinent past surgical history.  There were no vitals filed for this visit.  Visit Diagnosis:  Weakness of shoulder  Abnormal posture      Subjective Assessment - 08/11/15 1111    Subjective Pt reports her arm is feeling great, she sometimes forgets she hurt her shoulder.  Having some difficulty with laundry   Patient Stated Goals help take care of 10 grandchildren and lift the small ones. Fix hair    Currently in Pain? No/denies            [facility] PT Assessment - 08/11/15 0001    AROM   AROM Assessment Site Shoulder   Right/Left Shoulder Right   Right Shoulder Flexion 178 Degrees   Right Shoulder ABduction 168 Degrees   Right Shoulder Internal Rotation 90 Degrees   Right Shoulder External Rotation 80 Degrees   Strength   Overall Strength Comments mid traps 4/5   Strength Assessment Site Shoulder   Right/Left Shoulder Right   Right Shoulder Flexion 4+/5   Right Shoulder Extension 5/5   Right Shoulder ABduction --  5-/5   Right Shoulder Internal Rotation 5/5   Right Shoulder External Rotation 4+/5                     [facility] Adult PT Treatment/Exercise - 08/11/15 0001    Shoulder Exercises: Prone   Other Prone Exercises Ts 3x10 with 1#, 3x10 reps  supine scap stab ex per HEP with red band.    Shoulder Exercises: ROM/Strengthening   UBE (Upper Arm Bike) L1x4' alt FWD/BWD                PT Education - 08/11/15 1133    Education provided Yes   Education Details HEP   Person(s) Educated Patient   Methods Demonstration;Handout;Explanation   Comprehension Returned demonstration             PT Long Term Goals - 08/11/15 1142    PT LONG TERM GOAL #1   Title I with advanced HEP ( 08/26/15)    Status On-going   PT LONG TERM GOAL #2   Title place items on top shelf without difficulty ( 08/26/15)    Status Achieved   PT LONG TERM GOAL #3   Title demo Rt shoulder strength =/> 5-/5 without pain ( 08/26/15)    Status On-going   PT LONG TERM GOAL #4   Title demo upper back strength =/> 5-/5 ( 08/26/15)    Status On-going   PT LONG TERM GOAL #5   Title improve FOTO =/> 28% limited, CJ level ( 08/26/15)    Status On-going  Plan - 08/11/15 1333    Clinical Impression Statement This is pts second visit, she has been out of town.  Aariona reports she has remained painfree and has been doing her HEP. She has also started to do a little more reaching however is anxious.  She did well with her new HEP and progressing well.    Pt will benefit from skilled therapeutic intervention in order to improve on the following deficits Postural dysfunction;Decreased strength   Rehab Potential Excellent   PT Frequency 2x / week   PT Duration 4 weeks   PT Treatment/Interventions Ultrasound;Neuromuscular re-education;Patient/family education;Dry needling;Electrical Stimulation;Cryotherapy;Moist Heat;Therapeutic exercise;Manual techniques   PT Next Visit Plan See patient for one more time visit to progress HEP, if she is doing well then D/C    Consulted and Agree with Plan of Care Patient        Problem List Patient Active Problem List   Diagnosis Date Noted  . Dislocation of shoulder, right, closed 07/22/2015     Jeral Pinch PT 08/11/2015, 1:37 PM  [facility] [place], [place], [address] Phone: [phone]   Fax:  [phone]  Name: Kristin Romero MRN: [id number] Date of Birth: Nov 19, 1950

## 2015-08-24 ENCOUNTER — Ambulatory Visit (INDEPENDENT_AMBULATORY_CARE_PROVIDER_SITE_OTHER): Payer: Medicare Other | Admitting: Physical Therapy

## 2015-08-24 ENCOUNTER — Encounter: Payer: Self-pay | Admitting: Physical Therapy

## 2015-08-24 DIAGNOSIS — R29898 Other symptoms and signs involving the musculoskeletal system: Secondary | ICD-10-CM | POA: Diagnosis not present

## 2015-08-24 DIAGNOSIS — R293 Abnormal posture: Secondary | ICD-10-CM

## 2015-08-24 DIAGNOSIS — M25511 Pain in right shoulder: Secondary | ICD-10-CM | POA: Diagnosis present

## 2015-08-24 NOTE — Patient Instructions (Signed)
Strengthening: Resisted Flexion      [place] [phone]   Hold tubing with left arm at side. Pull forward and up. Move shoulder through pain-free range of motion. Repeat _10___ times per set. Do _2-3___ sets per session. Do __every other day__ sessions per day. Start with yellow band, build up to red.   Strengthening: Horizontal Abduction - with External Rotation (Prone)    Holding _1-2___ pound weights, raise arms out from sides, pinching shoulder blades. Keep elbows straight, thumbs up. Then perform again with palms down.  Repeat _10___ times per set. Do __3__ sets per session. Do _every other day___ sessions per day.  Copyright  VHI. All rights reserved.

## 2015-08-24 NOTE — Therapy (Addendum)
[place], [place], [address] Phone: [phone]   Fax:  [phone]  Physical Therapy Treatment  Patient Details  Name: Kristin Romero MRN: [id number] Date of Birth: 16-May-1951 Referring Provider: Dr Dianah Field  Encounter Date: 08/24/2015      PT End of Session - 08/24/15 1115    Visit Number 3   Date for PT Re-Evaluation 08/26/15   PT Start Time 1115  pt late   PT Stop Time 1152   PT Time Calculation (min) 37 min      Past Medical History  Diagnosis Date  . Hypertension   . ASCUS favor benign 09/2014    negative high risk HPV recommend repeat Pap smear one year    History reviewed. No pertinent past surgical history.  There were no vitals filed for this visit.  Visit Diagnosis:  Weakness of shoulder  Abnormal posture      Subjective Assessment - 08/24/15 1118    Subjective Pt states she is doing really well, no pain and her HEP is getting easier.    Currently in Pain? No/denies            [facility] PT Assessment - 08/24/15 0001    Assessment   Medical Diagnosis Rt shoulder dislocation   Referring Provider Dr Dianah Field   Onset Date/Surgical Date 07/22/15   Hand Dominance Right   Next MD Visit 3 wks   Prior Therapy none   Observation/Other Assessments   Focus on Therapeutic Outcomes (FOTO)  15% limited   AROM   AROM Assessment Site Shoulder   Right/Left Shoulder --  bilat WNL   Strength   Overall Strength Comments mid traps 4+/5   Strength Assessment Site Shoulder   Right/Left Shoulder Right   Right Shoulder Flexion 4+/5   Right Shoulder Extension 5/5   Right Shoulder ABduction --  5-/5   Right Shoulder Internal Rotation 5/5   Right Shoulder External Rotation 5/5                     [facility] Adult PT Treatment/Exercise - 08/24/15 0001    Self-Care   Self-Care --  issued green band to progress previous HEP    Shoulder Exercises: Supine    Internal Rotation Strengthening;Right;Weights  3x10   Internal Rotation Weight (lbs) 3#   Flexion Strengthening;Both;20 reps;Weights  from 90 degrees overhead and back.    Shoulder Flexion Weight (lbs) 3#   Other Supine Exercises yellow ball tosses in hooklying   Other Supine Exercises 20 circles CC/CCW holding yellow ball.    Shoulder Exercises: Prone   Other Prone Exercises Ts 3x10 with 1#  palms down, thumbs up, EOB   Shoulder Exercises: Standing   Flexion Strengthening;Right;Theraband  3x10   Theraband Level (Shoulder Flexion) Level 2 (Red)   Shoulder Exercises: ROM/Strengthening   UBE (Upper Arm Bike) L3x4' alt FWD/BWD                PT Education - 08/24/15 1137    Education provided Yes   Education Details HEP   Person(s) Educated Patient   Methods Explanation;Handout   Comprehension Returned demonstration             PT Long Term Goals - 08/24/15 1119    PT LONG TERM GOAL #1   Title I with advanced HEP ( 08/26/15)    Status Achieved   PT LONG TERM GOAL #2   Title place items on top shelf without  difficulty ( 08/26/15)    Status Achieved   PT LONG TERM GOAL #3   Title demo Rt shoulder strength =/> 5-/5 without pain ( 08/26/15)    Status Partially Met   PT LONG TERM GOAL #4   Title demo upper back strength =/> 5-/5 ( 08/26/15)    Status Not Met   PT LONG TERM GOAL #5   Title improve FOTO =/> 28% limited, CJ level ( 08/26/15)    Status Achieved  scored 15% limited               Plan - 08/24/15 1144    Clinical Impression Statement Natlie hs done very well with therapy.  She has met almost all her goals and progressing to the others.  She is pleased with her progress and ready for D/D to HEP    PT Next Visit Plan D/C to HEP    Consulted and Agree with Plan of Care Patient        Problem List Patient Active Problem List   Diagnosis Date Noted  . Dislocation of shoulder, right, closed 07/22/2015    Kristin Romero PT 08/24/2015, 1:09  PM  [facility] [place], [place], [address] Phone: [phone]   Fax:  [phone]  Name: Kristin Romero MRN: [id number] Date of Birth: June 26, 1950   PHYSICAL THERAPY DISCHARGE SUMMARY  Visits from Start of Care: 3  Current functional level related to goals / functional outcomes: See above   Remaining deficits: none   Education / Equipment: HEP  Plan: Patient agrees to discharge.  Patient goals were partially met. Patient is being discharged due to being pleased with the current functional level.  ?????    Kristin Romero, PT 08/24/2015 1:09 PM

## 2015-11-02 DIAGNOSIS — Z1231 Encounter for screening mammogram for malignant neoplasm of breast: Secondary | ICD-10-CM | POA: Diagnosis not present

## 2015-11-02 DIAGNOSIS — Z803 Family history of malignant neoplasm of breast: Secondary | ICD-10-CM | POA: Diagnosis not present

## 2015-11-18 DIAGNOSIS — Z0001 Encounter for general adult medical examination with abnormal findings: Secondary | ICD-10-CM | POA: Diagnosis not present

## 2015-11-18 DIAGNOSIS — E559 Vitamin D deficiency, unspecified: Secondary | ICD-10-CM | POA: Diagnosis not present

## 2015-11-18 DIAGNOSIS — R319 Hematuria, unspecified: Secondary | ICD-10-CM | POA: Diagnosis not present

## 2015-11-18 DIAGNOSIS — Z79899 Other long term (current) drug therapy: Secondary | ICD-10-CM | POA: Diagnosis not present

## 2015-11-18 DIAGNOSIS — Z609 Problem related to social environment, unspecified: Secondary | ICD-10-CM | POA: Diagnosis not present

## 2015-11-18 DIAGNOSIS — I10 Essential (primary) hypertension: Secondary | ICD-10-CM | POA: Diagnosis not present

## 2015-11-18 DIAGNOSIS — Z23 Encounter for immunization: Secondary | ICD-10-CM | POA: Diagnosis not present

## 2015-11-19 ENCOUNTER — Encounter: Payer: Self-pay | Admitting: Gynecology

## 2016-01-26 ENCOUNTER — Encounter: Payer: Medicare Other | Admitting: Gynecology

## 2016-01-26 DIAGNOSIS — R7303 Prediabetes: Secondary | ICD-10-CM | POA: Diagnosis not present

## 2016-01-26 DIAGNOSIS — R739 Hyperglycemia, unspecified: Secondary | ICD-10-CM | POA: Diagnosis not present

## 2016-08-11 IMAGING — CR DG SHOULDER 2+V*R*
2 series · 2 of 2 positions shown · non-contrast
Comparison: Earlier study of 07/22/2015

CLINICAL DATA: Post reduction RIGHT shoulder

EXAM:
RIGHT SHOULDER - 2+ VIEW

[shoulder grashey]
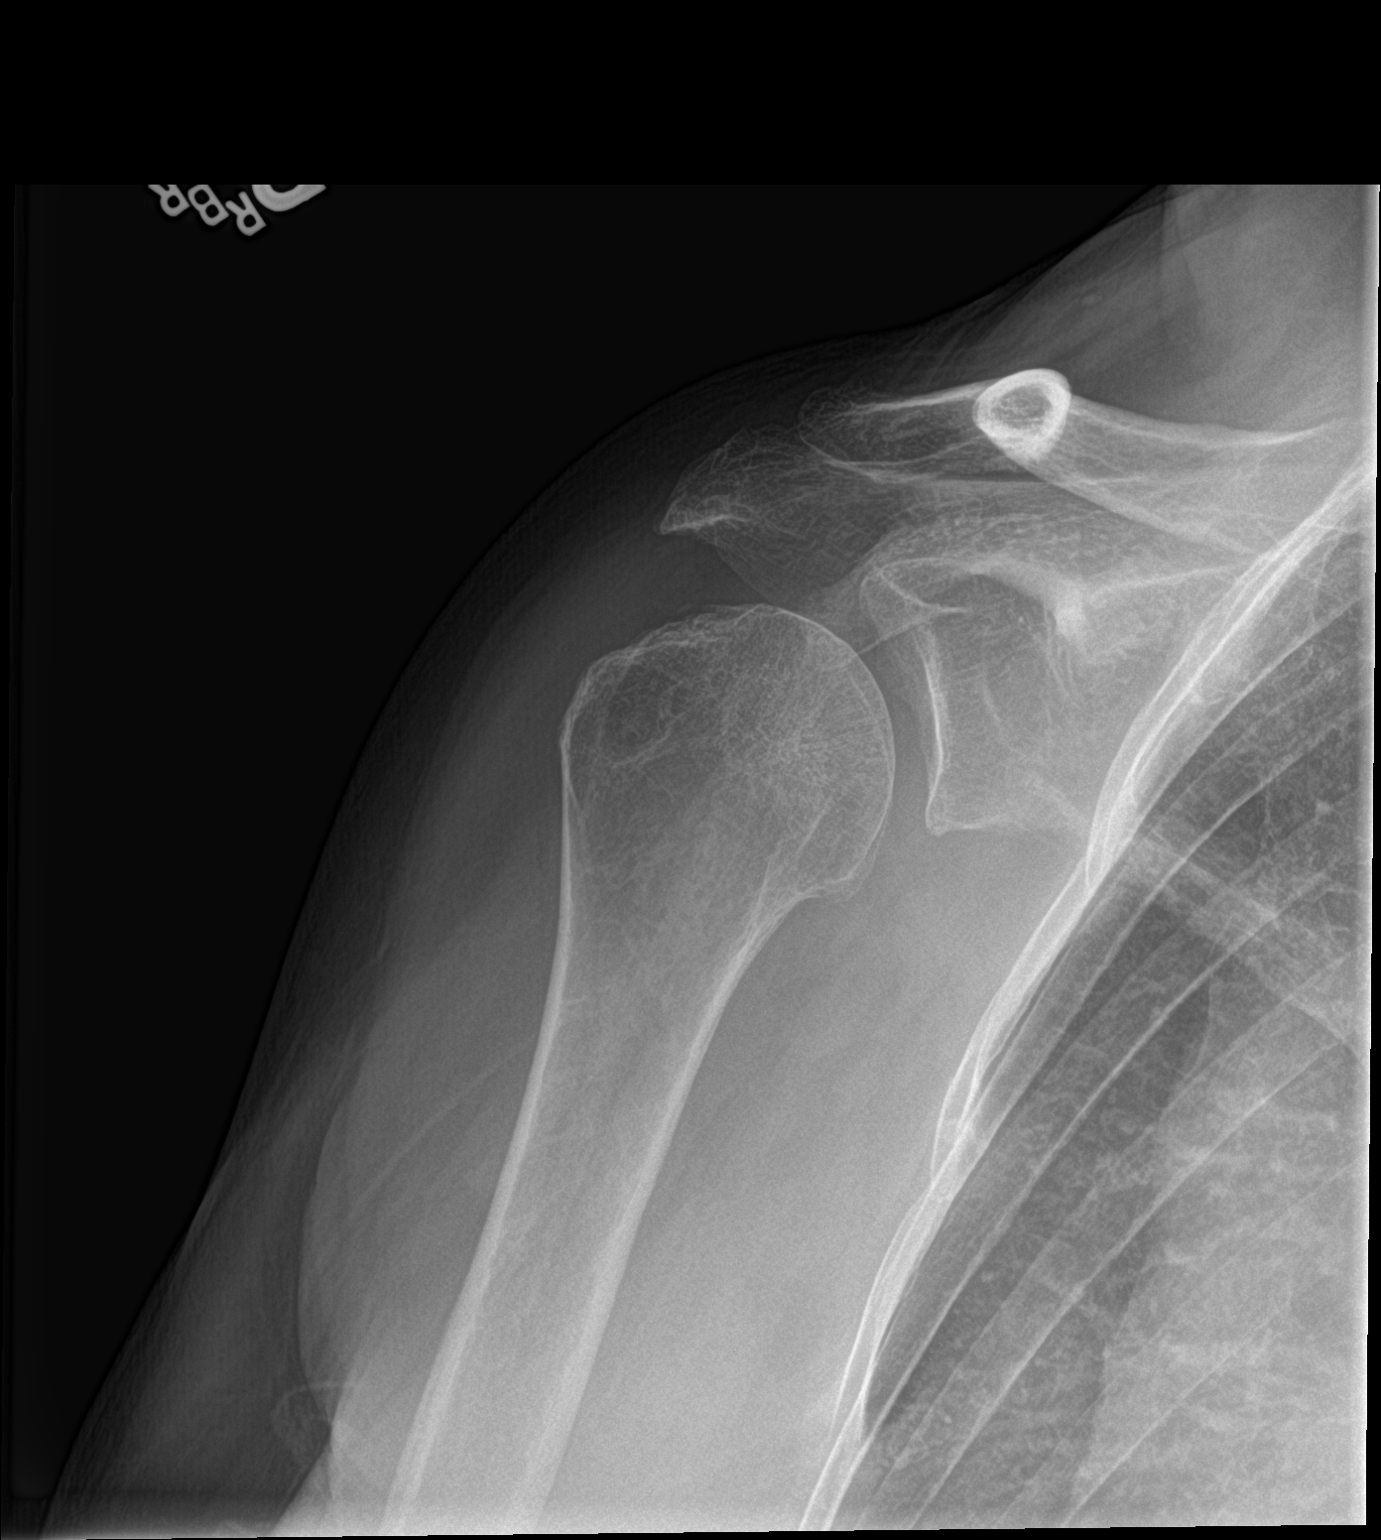

[shoulder y view]
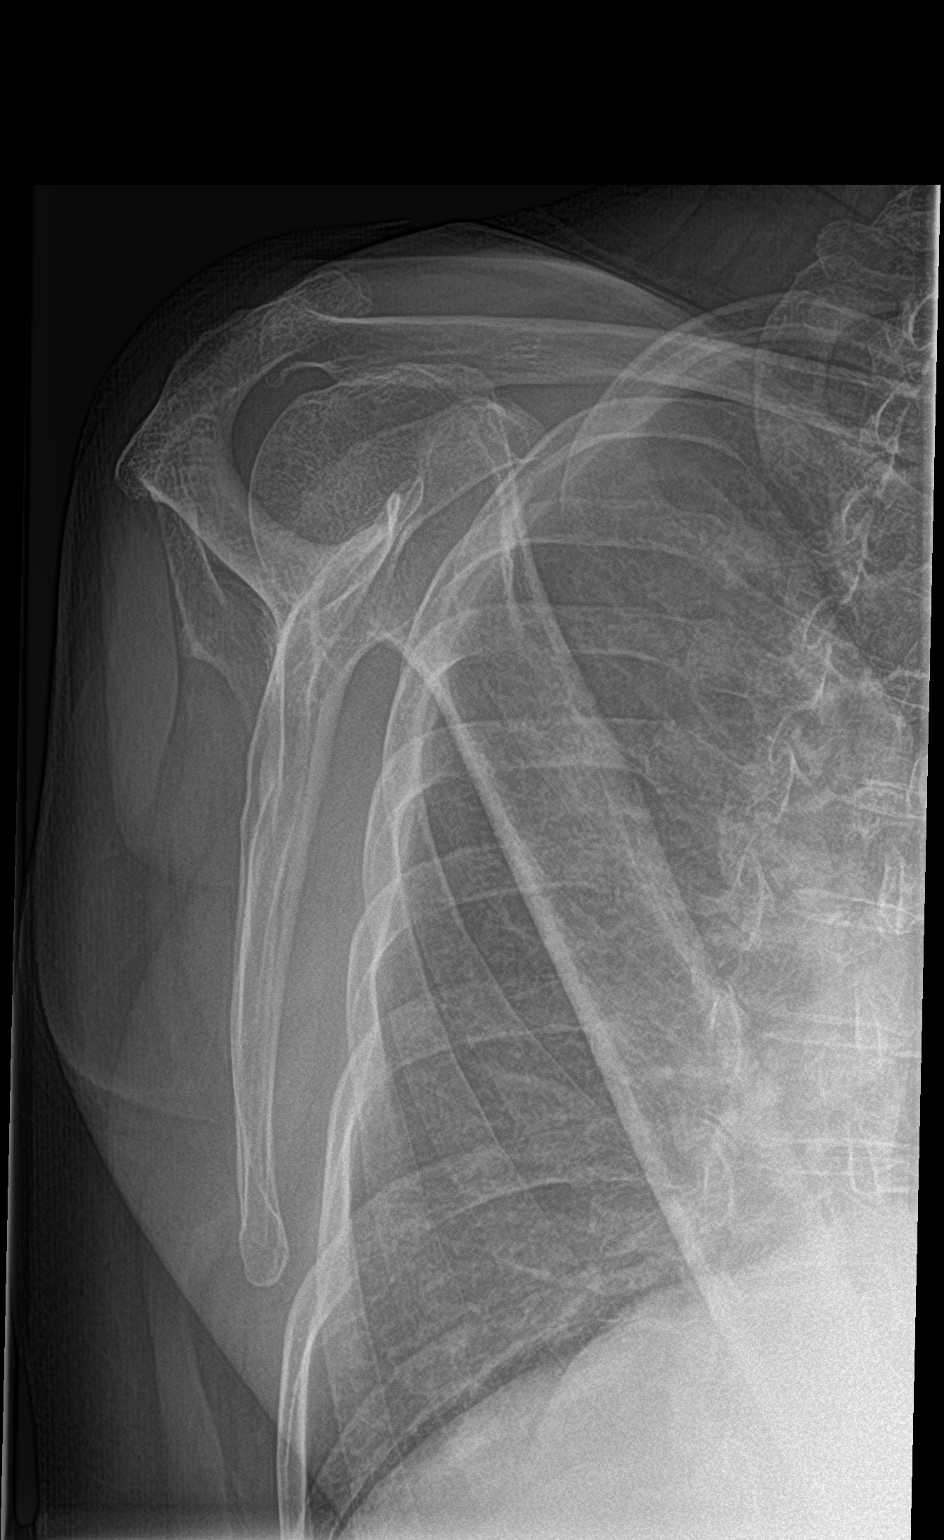

[2 of 2 positions shown; findings below may reference images not displayed]

FINDINGS: Bones appear demineralized.

RIGHT glenohumeral alignment now normal with reduction of previously
seen anterior dislocation.

AC joint alignment remains grossly normal.

Suspected Hill-Sachs impaction deformity at the RIGHT humeral head.

No other focal bony abnormality seen.
IMPRESSION: Reduction of previously identified glenohumeral dislocation with
suspected Hill-Sachs impaction deformity at the RIGHT humeral head.

## 2016-11-21 DIAGNOSIS — L255 Unspecified contact dermatitis due to plants, except food: Secondary | ICD-10-CM | POA: Diagnosis not present

## 2016-12-29 DIAGNOSIS — H5213 Myopia, bilateral: Secondary | ICD-10-CM | POA: Diagnosis not present

## 2016-12-29 DIAGNOSIS — H25813 Combined forms of age-related cataract, bilateral: Secondary | ICD-10-CM | POA: Diagnosis not present

## 2016-12-29 DIAGNOSIS — H524 Presbyopia: Secondary | ICD-10-CM | POA: Diagnosis not present

## 2016-12-29 DIAGNOSIS — H52223 Regular astigmatism, bilateral: Secondary | ICD-10-CM | POA: Diagnosis not present

## 2017-05-14 DIAGNOSIS — T148XXA Other injury of unspecified body region, initial encounter: Secondary | ICD-10-CM | POA: Diagnosis not present

## 2017-05-14 DIAGNOSIS — M79602 Pain in left arm: Secondary | ICD-10-CM | POA: Diagnosis not present

## 2017-05-14 DIAGNOSIS — I10 Essential (primary) hypertension: Secondary | ICD-10-CM | POA: Diagnosis not present

## 2017-05-15 DIAGNOSIS — S46212A Strain of muscle, fascia and tendon of other parts of biceps, left arm, initial encounter: Secondary | ICD-10-CM | POA: Diagnosis not present

## 2017-07-24 DIAGNOSIS — Z79899 Other long term (current) drug therapy: Secondary | ICD-10-CM | POA: Diagnosis not present

## 2017-07-24 DIAGNOSIS — Z1389 Encounter for screening for other disorder: Secondary | ICD-10-CM | POA: Diagnosis not present

## 2017-07-24 DIAGNOSIS — Z23 Encounter for immunization: Secondary | ICD-10-CM | POA: Diagnosis not present

## 2017-07-24 DIAGNOSIS — E559 Vitamin D deficiency, unspecified: Secondary | ICD-10-CM | POA: Diagnosis not present

## 2017-07-24 DIAGNOSIS — I10 Essential (primary) hypertension: Secondary | ICD-10-CM | POA: Diagnosis not present

## 2017-07-24 DIAGNOSIS — Z609 Problem related to social environment, unspecified: Secondary | ICD-10-CM | POA: Diagnosis not present

## 2017-07-24 DIAGNOSIS — R739 Hyperglycemia, unspecified: Secondary | ICD-10-CM | POA: Diagnosis not present

## 2017-07-24 DIAGNOSIS — Z0000 Encounter for general adult medical examination without abnormal findings: Secondary | ICD-10-CM | POA: Diagnosis not present

## 2017-07-24 DIAGNOSIS — D126 Benign neoplasm of colon, unspecified: Secondary | ICD-10-CM | POA: Diagnosis not present

## 2019-03-19 ENCOUNTER — Encounter: Payer: Self-pay | Admitting: Gynecology

## 2019-07-02 ENCOUNTER — Ambulatory Visit: Payer: Medicare Other | Attending: Internal Medicine

## 2019-07-02 DIAGNOSIS — Z23 Encounter for immunization: Secondary | ICD-10-CM | POA: Diagnosis not present

## 2019-07-02 NOTE — Progress Notes (Signed)
   Covid-19 Vaccination Clinic  Name:  Kristin Romero    MRN: [id number] DOB: 03-29-1951  07/02/2019  [name] was observed post Covid-19 immunization for 15 minutes without incidence. She was provided with Vaccine Information Sheet and instruction to access the V-Safe system.   [name] was instructed to call 911 with any severe reactions post vaccine: Marland Kitchen Difficulty breathing  . Swelling of your face and throat  . A fast heartbeat  . A bad rash all over your body  . Dizziness and weakness    Immunizations Administered    Name Date Dose VIS Date Route   Pfizer COVID-19 Vaccine 07/02/2019 12:57 PM 0.3 mL 05/23/2019 Intramuscular   Manufacturer: [place]   Lot: [id number]   [place]: [id number]

## 2019-07-09 DIAGNOSIS — Z79899 Other long term (current) drug therapy: Secondary | ICD-10-CM | POA: Diagnosis not present

## 2019-07-09 DIAGNOSIS — D126 Benign neoplasm of colon, unspecified: Secondary | ICD-10-CM | POA: Diagnosis not present

## 2019-07-09 DIAGNOSIS — I10 Essential (primary) hypertension: Secondary | ICD-10-CM | POA: Diagnosis not present

## 2019-07-09 DIAGNOSIS — E559 Vitamin D deficiency, unspecified: Secondary | ICD-10-CM | POA: Diagnosis not present

## 2019-07-09 DIAGNOSIS — R739 Hyperglycemia, unspecified: Secondary | ICD-10-CM | POA: Diagnosis not present

## 2019-07-09 DIAGNOSIS — Z609 Problem related to social environment, unspecified: Secondary | ICD-10-CM | POA: Diagnosis not present

## 2019-07-11 DIAGNOSIS — Z79899 Other long term (current) drug therapy: Secondary | ICD-10-CM | POA: Diagnosis not present

## 2019-07-11 DIAGNOSIS — E559 Vitamin D deficiency, unspecified: Secondary | ICD-10-CM | POA: Diagnosis not present

## 2019-07-11 DIAGNOSIS — I10 Essential (primary) hypertension: Secondary | ICD-10-CM | POA: Diagnosis not present

## 2019-07-11 DIAGNOSIS — R739 Hyperglycemia, unspecified: Secondary | ICD-10-CM | POA: Diagnosis not present

## 2019-07-22 ENCOUNTER — Ambulatory Visit: Payer: Medicare Other | Attending: Internal Medicine

## 2019-07-22 DIAGNOSIS — Z23 Encounter for immunization: Secondary | ICD-10-CM | POA: Insufficient documentation

## 2019-07-22 NOTE — Progress Notes (Signed)
   Covid-19 Vaccination Clinic  Name:  Kristin Romero    MRN: [id number] DOB: 1951/04/03  07/22/2019  [name] was observed post Covid-19 immunization for 15 minutes without incidence. She was provided with Vaccine Information Sheet and instruction to access the V-Safe system.   [name] was instructed to call 911 with any severe reactions post vaccine: Marland Kitchen Difficulty breathing  . Swelling of your face and throat  . A fast heartbeat  . A bad rash all over your body  . Dizziness and weakness    Immunizations Administered    Name Date Dose VIS Date Route   Pfizer COVID-19 Vaccine 07/22/2019 11:20 AM 0.3 mL 05/23/2019 Intramuscular   Manufacturer: [place]   Lot: [id number]   [place]: [id number]

## 2019-09-16 DIAGNOSIS — H2513 Age-related nuclear cataract, bilateral: Secondary | ICD-10-CM | POA: Diagnosis not present

## 2019-09-16 DIAGNOSIS — H5213 Myopia, bilateral: Secondary | ICD-10-CM | POA: Diagnosis not present

## 2019-09-16 DIAGNOSIS — H52223 Regular astigmatism, bilateral: Secondary | ICD-10-CM | POA: Diagnosis not present

## 2019-09-16 DIAGNOSIS — H524 Presbyopia: Secondary | ICD-10-CM | POA: Diagnosis not present

## 2020-02-25 DIAGNOSIS — Z7189 Other specified counseling: Secondary | ICD-10-CM | POA: Diagnosis not present

## 2020-02-25 DIAGNOSIS — Z658 Other specified problems related to psychosocial circumstances: Secondary | ICD-10-CM | POA: Diagnosis not present

## 2020-02-25 DIAGNOSIS — I10 Essential (primary) hypertension: Secondary | ICD-10-CM | POA: Diagnosis not present

## 2020-02-25 DIAGNOSIS — Z1389 Encounter for screening for other disorder: Secondary | ICD-10-CM | POA: Diagnosis not present

## 2020-02-25 DIAGNOSIS — R739 Hyperglycemia, unspecified: Secondary | ICD-10-CM | POA: Diagnosis not present

## 2020-02-25 DIAGNOSIS — D126 Benign neoplasm of colon, unspecified: Secondary | ICD-10-CM | POA: Diagnosis not present

## 2020-02-25 DIAGNOSIS — Z609 Problem related to social environment, unspecified: Secondary | ICD-10-CM | POA: Diagnosis not present

## 2020-02-25 DIAGNOSIS — Z0000 Encounter for general adult medical examination without abnormal findings: Secondary | ICD-10-CM | POA: Diagnosis not present

## 2020-02-25 DIAGNOSIS — Z79899 Other long term (current) drug therapy: Secondary | ICD-10-CM | POA: Diagnosis not present

## 2020-02-25 DIAGNOSIS — E559 Vitamin D deficiency, unspecified: Secondary | ICD-10-CM | POA: Diagnosis not present

## 2020-02-25 DIAGNOSIS — R000 Tachycardia, unspecified: Secondary | ICD-10-CM | POA: Diagnosis not present

## 2020-03-08 DIAGNOSIS — Z23 Encounter for immunization: Secondary | ICD-10-CM | POA: Diagnosis not present

## 2021-03-02 DIAGNOSIS — Z658 Other specified problems related to psychosocial circumstances: Secondary | ICD-10-CM | POA: Diagnosis not present

## 2021-03-02 DIAGNOSIS — D126 Benign neoplasm of colon, unspecified: Secondary | ICD-10-CM | POA: Diagnosis not present

## 2021-03-02 DIAGNOSIS — Z609 Problem related to social environment, unspecified: Secondary | ICD-10-CM | POA: Diagnosis not present

## 2021-03-02 DIAGNOSIS — R7309 Other abnormal glucose: Secondary | ICD-10-CM | POA: Diagnosis not present

## 2021-03-02 DIAGNOSIS — Z1389 Encounter for screening for other disorder: Secondary | ICD-10-CM | POA: Diagnosis not present

## 2021-03-02 DIAGNOSIS — Z0000 Encounter for general adult medical examination without abnormal findings: Secondary | ICD-10-CM | POA: Diagnosis not present

## 2021-03-02 DIAGNOSIS — R739 Hyperglycemia, unspecified: Secondary | ICD-10-CM | POA: Diagnosis not present

## 2021-03-02 DIAGNOSIS — Z79899 Other long term (current) drug therapy: Secondary | ICD-10-CM | POA: Diagnosis not present

## 2021-03-02 DIAGNOSIS — I10 Essential (primary) hypertension: Secondary | ICD-10-CM | POA: Diagnosis not present

## 2021-03-02 DIAGNOSIS — E559 Vitamin D deficiency, unspecified: Secondary | ICD-10-CM | POA: Diagnosis not present

## 2021-03-02 DIAGNOSIS — R000 Tachycardia, unspecified: Secondary | ICD-10-CM | POA: Diagnosis not present

## 2021-03-02 DIAGNOSIS — Z800 Family history of malignant neoplasm of digestive organs: Secondary | ICD-10-CM | POA: Diagnosis not present

## 2021-03-02 DIAGNOSIS — Z7189 Other specified counseling: Secondary | ICD-10-CM | POA: Diagnosis not present

## 2021-03-08 DIAGNOSIS — H52223 Regular astigmatism, bilateral: Secondary | ICD-10-CM | POA: Diagnosis not present

## 2021-03-08 DIAGNOSIS — H2513 Age-related nuclear cataract, bilateral: Secondary | ICD-10-CM | POA: Diagnosis not present

## 2021-03-08 DIAGNOSIS — H5213 Myopia, bilateral: Secondary | ICD-10-CM | POA: Diagnosis not present

## 2021-03-17 DIAGNOSIS — Z23 Encounter for immunization: Secondary | ICD-10-CM | POA: Diagnosis not present

## 2021-08-07 ENCOUNTER — Encounter: Payer: Self-pay | Admitting: Internal Medicine

## 2021-12-06 ENCOUNTER — Ambulatory Visit (INDEPENDENT_AMBULATORY_CARE_PROVIDER_SITE_OTHER): Payer: Medicare Other | Admitting: Sports Medicine

## 2021-12-06 ENCOUNTER — Ambulatory Visit (INDEPENDENT_AMBULATORY_CARE_PROVIDER_SITE_OTHER): Payer: Medicare Other

## 2021-12-06 DIAGNOSIS — M1611 Unilateral primary osteoarthritis, right hip: Secondary | ICD-10-CM | POA: Diagnosis not present

## 2021-12-06 DIAGNOSIS — M7061 Trochanteric bursitis, right hip: Secondary | ICD-10-CM

## 2021-12-06 NOTE — Progress Notes (Signed)
    Procedures performed today:    Procedure: Real-time Ultrasound Guided injection of the right greater trochanteric bursa Device: Samsung HS60  Verbal informed consent obtained.  Time-out conducted.  Noted no overlying erythema, induration, or other signs of local infection.  Skin prepped in a sterile fashion.  Local anesthesia: Topical Ethyl chloride.  With sterile technique and under real time ultrasound guidance: 22-gauge spinal needle advanced to the greater trochanteric bursa, contacted bone and then injected 1 cc Kenalog 40, 2 cc lidocaine, 2 cc bupivacaine easily Completed without difficulty  Advised to call if fevers/chills, erythema, induration, drainage, or persistent bleeding.  Images permanently stored and available for review in PACS.  Impression: Technically successful ultrasound guided injection.  Independent interpretation of notes and tests performed by another provider:   None.  Brief History, Exam, Impression, and Recommendations:    Trochanteric bursitis, right hip Several months of pain right lateral hip, tenderness over the greater trochanter, injection today per patient request. We will do some home conditioning, return to see me in 6 weeks.  Chronic process with exacerbation and pharmacologic intervention  ____________________________________________ Debby PARAS. Curtis, M.D., ABFM., CAQSM., AME. Primary Care and Sports Medicine [facility]  Adjunct Professor of [facility] Medicine  [employer]  Rest[employer], [employer]

## 2021-12-06 NOTE — Assessment & Plan Note (Addendum)
Several months of pain right lateral hip, tenderness over the greater trochanter, injection today per patient request. We will do some home conditioning, return to see me in 6 weeks.  Update: Patient agreeable to do formal physical therapy, ordering this now.

## 2021-12-09 NOTE — Addendum Note (Signed)
Addended by: Silverio Decamp on: 12/09/2021 09:01 AM   Modules accepted: Orders

## 2021-12-21 ENCOUNTER — Emergency Department (HOSPITAL_BASED_OUTPATIENT_CLINIC_OR_DEPARTMENT_OTHER): Payer: Medicare Other | Admitting: Radiology

## 2021-12-21 ENCOUNTER — Emergency Department (HOSPITAL_BASED_OUTPATIENT_CLINIC_OR_DEPARTMENT_OTHER): Payer: Medicare Other

## 2021-12-21 ENCOUNTER — Emergency Department (HOSPITAL_BASED_OUTPATIENT_CLINIC_OR_DEPARTMENT_OTHER)
Admission: EM | Admit: 2021-12-21 | Discharge: 2021-12-21 | Disposition: A | Discharge status: Home / Self Care | Payer: Medicare Other | Attending: Emergency Medicine | Admitting: Emergency Medicine

## 2021-12-21 ENCOUNTER — Other Ambulatory Visit (HOSPITAL_BASED_OUTPATIENT_CLINIC_OR_DEPARTMENT_OTHER): Payer: Self-pay

## 2021-12-21 ENCOUNTER — Other Ambulatory Visit: Payer: Self-pay

## 2021-12-21 ENCOUNTER — Encounter (HOSPITAL_BASED_OUTPATIENT_CLINIC_OR_DEPARTMENT_OTHER): Payer: Self-pay

## 2021-12-21 DIAGNOSIS — S0990XA Unspecified injury of head, initial encounter: Secondary | ICD-10-CM

## 2021-12-21 DIAGNOSIS — M85812 Other specified disorders of bone density and structure, left shoulder: Secondary | ICD-10-CM | POA: Diagnosis not present

## 2021-12-21 DIAGNOSIS — R9431 Abnormal electrocardiogram [ECG] [EKG]: Secondary | ICD-10-CM | POA: Diagnosis not present

## 2021-12-21 DIAGNOSIS — S0631AA Contusion and laceration of right cerebrum with loss of consciousness status unknown, initial encounter: Secondary | ICD-10-CM

## 2021-12-21 DIAGNOSIS — W1839XA Other fall on same level, initial encounter: Secondary | ICD-10-CM | POA: Diagnosis not present

## 2021-12-21 DIAGNOSIS — S43014A Anterior dislocation of right humerus, initial encounter: Secondary | ICD-10-CM | POA: Insufficient documentation

## 2021-12-21 DIAGNOSIS — S02401A Maxillary fracture, unspecified, initial encounter for closed fracture: Secondary | ICD-10-CM | POA: Diagnosis not present

## 2021-12-21 DIAGNOSIS — S0633AA Contusion and laceration of cerebrum, unspecified, with loss of consciousness status unknown, initial encounter: Secondary | ICD-10-CM | POA: Insufficient documentation

## 2021-12-21 DIAGNOSIS — S0591XA Unspecified injury of right eye and orbit, initial encounter: Secondary | ICD-10-CM | POA: Diagnosis present

## 2021-12-21 DIAGNOSIS — W19XXXA Unspecified fall, initial encounter: Secondary | ICD-10-CM

## 2021-12-21 DIAGNOSIS — Z79899 Other long term (current) drug therapy: Secondary | ICD-10-CM | POA: Diagnosis not present

## 2021-12-21 DIAGNOSIS — S06339A Contusion and laceration of cerebrum, unspecified, with loss of consciousness of unspecified duration, initial encounter: Secondary | ICD-10-CM | POA: Diagnosis not present

## 2021-12-21 DIAGNOSIS — I10 Essential (primary) hypertension: Secondary | ICD-10-CM | POA: Insufficient documentation

## 2021-12-21 DIAGNOSIS — S43034A Inferior dislocation of right humerus, initial encounter: Secondary | ICD-10-CM | POA: Insufficient documentation

## 2021-12-21 DIAGNOSIS — S0003XA Contusion of scalp, initial encounter: Secondary | ICD-10-CM | POA: Diagnosis not present

## 2021-12-21 DIAGNOSIS — Y92002 Bathroom of unspecified non-institutional (private) residence single-family (private) house as the place of occurrence of the external cause: Secondary | ICD-10-CM | POA: Insufficient documentation

## 2021-12-21 DIAGNOSIS — Z043 Encounter for examination and observation following other accident: Secondary | ICD-10-CM | POA: Diagnosis not present

## 2021-12-21 DIAGNOSIS — S0285XA Fracture of orbit, unspecified, initial encounter for closed fracture: Secondary | ICD-10-CM

## 2021-12-21 DIAGNOSIS — S0231XA Fracture of orbital floor, right side, initial encounter for closed fracture: Secondary | ICD-10-CM | POA: Diagnosis not present

## 2021-12-21 DIAGNOSIS — M47812 Spondylosis without myelopathy or radiculopathy, cervical region: Secondary | ICD-10-CM | POA: Diagnosis not present

## 2021-12-21 DIAGNOSIS — M25511 Pain in right shoulder: Secondary | ICD-10-CM | POA: Diagnosis not present

## 2021-12-21 DIAGNOSIS — M25512 Pain in left shoulder: Secondary | ICD-10-CM | POA: Diagnosis not present

## 2021-12-21 LAB — CBC
HCT: 37.5 % (ref 36.0–46.0)
Hemoglobin: 12.9 g/dL (ref 12.0–15.0)
MCH: 33.5 pg (ref 26.0–34.0)
MCHC: 34.4 g/dL (ref 30.0–36.0)
MCV: 97.4 fL (ref 80.0–100.0)
Platelets: 174 10*3/uL (ref 150–400)
RBC: 3.85 MIL/uL — ABNORMAL LOW (ref 3.87–5.11)
RDW: 12.4 % (ref 11.5–15.5)
WBC: 11.9 10*3/uL — ABNORMAL HIGH (ref 4.0–10.5)
nRBC: 0 % (ref 0.0–0.2)

## 2021-12-21 LAB — BASIC METABOLIC PANEL
Anion gap: 12 (ref 5–15)
BUN: 22 mg/dL (ref 8–23)
CO2: 27 mmol/L (ref 22–32)
Calcium: 9.3 mg/dL (ref 8.9–10.3)
Chloride: 96 mmol/L — ABNORMAL LOW (ref 98–111)
Creatinine, Ser: 1.01 mg/dL — ABNORMAL HIGH (ref 0.44–1.00)
GFR, Estimated: 59 mL/min — ABNORMAL LOW (ref >60)
Glucose, Bld: 124 mg/dL — ABNORMAL HIGH (ref 70–99)
Potassium: 3.7 mmol/L (ref 3.5–5.1)
Sodium: 135 mmol/L (ref 135–145)

## 2021-12-21 MED ORDER — HYDROCODONE-ACETAMINOPHEN 5-325 MG PO TABS
1.0000 | ORAL_TABLET | Freq: Four times a day (QID) | ORAL | 0 refills | Status: DC | PRN
  Filled 2021-12-21: qty 14, 2d supply, fill #0

## 2021-12-21 MED ORDER — FENTANYL CITRATE PF 50 MCG/ML IJ SOSY
PREFILLED_SYRINGE | INTRAMUSCULAR | Status: AC
  Administered 2021-12-21: 50 ug via INTRAVENOUS
  Filled 2021-12-21: qty 1

## 2021-12-21 MED ORDER — ETOMIDATE 2 MG/ML IV SOLN
10.0000 mg | Freq: Once | INTRAVENOUS | Status: DC
  Filled 2021-12-21: qty 10

## 2021-12-21 MED ORDER — ETOMIDATE 2 MG/ML IV SOLN
INTRAVENOUS | Status: AC | PRN
  Administered 2021-12-21 (×3): 5 mg via INTRAVENOUS

## 2021-12-21 MED ORDER — FENTANYL CITRATE PF 50 MCG/ML IJ SOSY
25.0000 ug | PREFILLED_SYRINGE | Freq: Once | INTRAMUSCULAR | Status: AC
  Administered 2021-12-21: 25 ug via INTRAVENOUS
  Filled 2021-12-21: qty 1

## 2021-12-21 MED ORDER — FENTANYL CITRATE PF 50 MCG/ML IJ SOSY: 50.0000 ug | PREFILLED_SYRINGE | Freq: Once | INTRAMUSCULAR | Status: AC

## 2021-12-21 NOTE — ED Notes (Signed)
RT at bedside for sedation procedure. ETCO2 remained 30-36 throughout procedure.

## 2021-12-21 NOTE — ED Triage Notes (Signed)
Patient here POV from Home.  Endorses sustaining a Fall today. Unsure why Patient Golden Circle but Patient states she was attempting to use the Bathroom this AM. Family states she was found screaming for assistance this AM at 1000 approximately.  Endorses Pain to Right Shoulder, Right Eye. Severe Swelling and Bruising noted to Right Eye. Deformity noted to Right Shoulder as well.   No Anticoagulants. Possible LOC (Most Likely).   NAD Noted during Triage. A&Ox4. GCS 15. BIB Wheelchair.

## 2021-12-21 NOTE — ED Notes (Signed)
Pt tolerated PO fluids

## 2021-12-21 NOTE — ED Notes (Signed)
Dr. Zackowski at bedside  

## 2021-12-21 NOTE — Discharge Instructions (Addendum)
Take the hydrocodone as needed for pain.  He can appointment to follow-up with neurosurgery Dr. Arnoldo Morale as headaches things or not resolved in a week.  Very important you make an appointment to follow-up with ear nose and throat.  Information provided above this is for the orbital floor fracture.  Follow-up with a sports medicine that you are followed by med [facility] for the shoulder dislocation.  Regarding the shoulder dislocation keep the shoulder immobilizer in place at all times can remove it to take a shower however keep your arm bent at the elbow and up against your abdomen and chest area.  Medications will be provided by the pharmacy here.

## 2021-12-21 NOTE — ED Notes (Signed)
Called for the on call Neurosurgery again. The representative stated that she will contact them again.

## 2021-12-21 NOTE — ED Provider Notes (Signed)
[facility] Provider Note   CSN: [id number] Arrival date & time: 12/21/21  1114     History  Chief Complaint  Patient presents with   Kristin Romero is a 71 y.o. female.  Patient brought in by POV.  Patient had a fall sometime this morning not exactly sure when because it was not witnessed.  Family heard her calling out at around 10 on the morning they noted that she had fallen in the bathroom there was blood on the floor and had a lot of swelling to her right eye complaining of right shoulder pain has had dislocations of the right shoulder in the past.  And a lot of ecchymosis around the right eye.  No evidence of any active bleeding.  Also some ecchymosis to the right forehead area.  Patient's blood pressure on arrival was good at 123/68.  Does sound as if there probably was some loss of consciousness but not clear.  Medical history significant for hypertension.  Patient not on blood thinners.       Home Medications Prior to Admission medications   Medication Sig Start Date End Date Taking? Authorizing Provider  amLODipine (NORVASC) 5 MG tablet Take 5 mg by mouth daily. 11/16/21  Yes [provider]  escitalopram (LEXAPRO) 10 MG tablet Take 5 mg by mouth daily.   Yes [provider]  HYDROcodone-acetaminophen (NORCO/VICODIN) 5-325 MG tablet Take 1-2 tablets by mouth every 6 (six) hours as needed for moderate pain. 12/21/21  Yes Fredia Sorrow, MD  losartan-hydrochlorothiazide (HYZAAR) 100-25 MG tablet Take 1 tablet by mouth daily. 10/04/21  Yes [provider]  Multiple Vitamins-Minerals (CENTRUM PO) Take 1 tablet by mouth daily.   Yes [provider]  HYDROcodone-acetaminophen (NORCO/VICODIN) 5-325 MG tablet Take 1 tablet by mouth every 8 (eight) hours as needed for moderate pain. Patient not taking: Reported on 12/21/2021 07/22/15   Silverio Decamp, MD      Allergies    Patient has no known  allergies.    Review of Systems   Review of Systems  Constitutional:  Negative for chills and fever.  HENT:  Positive for facial swelling. Negative for ear pain and sore throat.   Eyes:  Negative for pain and visual disturbance.  Respiratory:  Negative for cough and shortness of breath.   Cardiovascular:  Negative for chest pain and palpitations.  Gastrointestinal:  Negative for abdominal pain and vomiting.  Genitourinary:  Negative for dysuria and hematuria.  Musculoskeletal:  Negative for arthralgias and back pain.  Skin:  Negative for color change and rash.  Neurological:  Negative for seizures and syncope.  All other systems reviewed and are negative.   Physical Exam Updated Vital Signs BP (!) 150/86   Pulse 89   Temp 98.4 F (36.9 C)   Resp 18   Ht 1.575 m ('5\' 2"'$ )   Wt 70.8 kg   SpO2 92%   BMI 28.55 kg/m  Physical Exam Vitals and nursing note reviewed.  Constitutional:      General: She is not in acute distress.    Appearance: She is well-developed.  HENT:     Head: Normocephalic.     Comments: Contusion ecchymosis to the right forehead area and around the right eye particularly inferiorly. Eyes:     Extraocular Movements: Extraocular movements intact.     Conjunctiva/sclera: Conjunctivae normal.     Pupils: Pupils are equal, round, and reactive to light.     Comments:  No evidence of any extraocular muscles entrapment degree of the right eye.  The right sclera with a little bit of a hematoma no hyphema.  Some edema to the sclera.  Patient able to close the right eye.  Cardiovascular:     Rate and Rhythm: Normal rate and regular rhythm.     Heart sounds: No murmur heard. Pulmonary:     Effort: Pulmonary effort is normal. No respiratory distress.     Breath sounds: Normal breath sounds.  Abdominal:     Palpations: Abdomen is soft.     Tenderness: There is no abdominal tenderness.  Musculoskeletal:        General: Deformity present. No swelling.     Cervical  back: Neck supple.     Comments: Obvious deformity to the right shoulder.  Radial pulse distally is 2+.  Skin:    General: Skin is warm and dry.     Capillary Refill: Capillary refill takes less than 2 seconds.  Neurological:     General: No focal deficit present.     Mental Status: She is alert and oriented to person, place, and time.     Cranial Nerves: No cranial nerve deficit.     Sensory: No sensory deficit.     Motor: No weakness.  Psychiatric:        Mood and Affect: Mood normal.     ED Results / Procedures / Treatments   Labs (all labs ordered are listed, but only abnormal results are displayed) Labs Reviewed  BASIC METABOLIC PANEL - Abnormal; Notable for the following components:      Result Value   Chloride 96 (*)    Glucose, Bld 124 (*)    Creatinine, Ser 1.01 (*)    GFR, Estimated 59 (*)    All other components within normal limits  CBC - Abnormal; Notable for the following components:   WBC 11.9 (*)    RBC 3.85 (*)    All other components within normal limits  URINALYSIS, ROUTINE W REFLEX MICROSCOPIC  CBG MONITORING, ED    EKG EKG Interpretation  Date/Time:  Wednesday December 21 2021 11:53:21 EDT Ventricular Rate:  75 PR Interval:  149 QRS Duration: 89 QT Interval:  412 QTC Calculation: 461 R Axis:   22 Text Interpretation: Sinus rhythm Low voltage, precordial leads Consider anterior infarct No previous ECGs available Confirmed by Fredia Sorrow [phone]) on 12/21/2021 12:01:19 PM  Radiology DG Shoulder Right  Result Date: 12/21/2021 CLINICAL DATA:  Post reduction EXAM: RIGHT SHOULDER - 1 VIEW COMPARISON:  Shoulder radiograph dated December 21, 2021 FINDINGS: Single AP view of the right shoulder demonstrates anatomic alignment. Mild osseous irregularity of the humerus near the area of the glenoid. Soft tissues are unremarkable. IMPRESSION: 1. Single AP view of the right shoulder demonstrates anatomic alignment. 2. Mild osseous irregularity of the humerus near  the area of the glenoid, likely degenerative or artifactual. Recommend additional views of the shoulder evaluate for fracture. Electronically Signed   By: Yetta Glassman M.D.   On: 12/21/2021 14:17   CT Head Wo Contrast  Result Date: 12/21/2021 CLINICAL DATA:  [place] today. Right-sided facial trauma. Stepped CT with EXAM: CT HEAD WITHOUT CONTRAST CT MAXILLOFACIAL WITHOUT CONTRAST CT CERVICAL SPINE WITHOUT CONTRAST TECHNIQUE: Multidetector CT imaging of the head, cervical spine, and maxillofacial structures were performed using the standard protocol without intravenous contrast. Multiplanar CT image reconstructions of the cervical spine and maxillofacial structures were also generated. RADIATION DOSE REDUCTION: This exam was performed according  to the departmental dose-optimization program which includes automated exposure control, adjustment of the mA and/or kV according to patient size and/or use of iterative reconstruction technique. COMPARISON:  None Available. FINDINGS: CT HEAD FINDINGS Brain: There is a focal hemorrhagic contusion in the right temporal lobe. Smaller parenchymal hemorrhagic contusions in the right frontal lobe. No subdural or subarachnoid hemorrhage is identified. No mass effect or midline shift. The brainstem and cerebellum are unremarkable. Age related cerebral atrophy, mild ventriculomegaly and periventricular white matter disease. Remote appearing basal ganglia lacunar type infarcts are noted. Vascular: Scattered vascular calcifications but no aneurysm or hyperdense vessels. Skull: No skull fracture or bone lesion. There is a fracture of the right maxillary sinus anterior wall better seen on the facial CT. There is moderate hematoma in the right maxillary sinus. Other: Right frontal scalp hematoma. CT MAXILLOFACIAL FINDINGS Osseous: Mildly depressed fracture involving the anterior wall the right maxillary sinus with maximum depression of approximately 4 mm. Suspect small fractures  involving the lateral wall also with adjacent air in the soft tissues. There is a displaced orbital floor fracture on the right with a trapped door appearance. Maximum depression is 5 mm. The other orbital walls are intact. No entrapment of the inferior rectus muscle is identified. Orbits: Right orbital floor fracture. No other orbital wall fractures are identified. The globes are intact. Sinuses: Hemorrhage and fluid in the right maxillary sinus and scattered right ethmoid sinuses. There is also mucoperiosteal thickening involving the right half of the sphenoid sinus and mild sinus wall thickening suggesting chronic changes. Soft tissues: Sizable right frontal and periorbital hematoma. CT CERVICAL SPINE FINDINGS Alignment: Degenerative cervical spondylosis with degenerative subluxations but the overall alignment is maintained. Skull base and vertebrae: No cervical spine fractures are identified. The facets are normally aligned. No facet or laminar fractures. Soft tissues and spinal canal: No prevertebral fluid or swelling. No visible canal hematoma. Disc levels: Degenerative cervical spondylosis with multilevel disc disease and facet disease but no large disc protrusion or significant canal compromise. Upper chest: The lung apices are grossly clear. Other: Indeterminate 19 mm right thyroid lobe lesion. Recommend thyroid US (ref: J Am Coll Radiol. 2015 Feb;12(2): 143-50). IMPRESSION: 1. Small hemorrhagic parenchymal brain contusions involving the right frontal lobe and right temporal lobe. No epidural, subdural or subarachnoid hemorrhage. No mass effect or shift. 2. Age related cerebral atrophy, ventriculomegaly and periventricular white matter disease. Remote lacunar type basal ganglia infarcts. 3. Right orbital floor fracture and right maxillary sinus wall fractures as detailed above. No other facial bone fractures are identified. 4. Right frontal scalp and right periorbital hematomas. 5. No cervical spine  fractures. Electronically Signed   By: Marijo Sanes M.D.   On: 12/21/2021 12:50   CT Cervical Spine Wo Contrast  Result Date: 12/21/2021 CLINICAL DATA:  [place] today. Right-sided facial trauma. Stepped CT with EXAM: CT HEAD WITHOUT CONTRAST CT MAXILLOFACIAL WITHOUT CONTRAST CT CERVICAL SPINE WITHOUT CONTRAST TECHNIQUE: Multidetector CT imaging of the head, cervical spine, and maxillofacial structures were performed using the standard protocol without intravenous contrast. Multiplanar CT image reconstructions of the cervical spine and maxillofacial structures were also generated. RADIATION DOSE REDUCTION: This exam was performed according to the departmental dose-optimization program which includes automated exposure control, adjustment of the mA and/or kV according to patient size and/or use of iterative reconstruction technique. COMPARISON:  None Available. FINDINGS: CT HEAD FINDINGS Brain: There is a focal hemorrhagic contusion in the right temporal lobe. Smaller parenchymal hemorrhagic contusions in the right frontal lobe.  No subdural or subarachnoid hemorrhage is identified. No mass effect or midline shift. The brainstem and cerebellum are unremarkable. Age related cerebral atrophy, mild ventriculomegaly and periventricular white matter disease. Remote appearing basal ganglia lacunar type infarcts are noted. Vascular: Scattered vascular calcifications but no aneurysm or hyperdense vessels. Skull: No skull fracture or bone lesion. There is a fracture of the right maxillary sinus anterior wall better seen on the facial CT. There is moderate hematoma in the right maxillary sinus. Other: Right frontal scalp hematoma. CT MAXILLOFACIAL FINDINGS Osseous: Mildly depressed fracture involving the anterior wall the right maxillary sinus with maximum depression of approximately 4 mm. Suspect small fractures involving the lateral wall also with adjacent air in the soft tissues. There is a displaced orbital floor fracture  on the right with a trapped door appearance. Maximum depression is 5 mm. The other orbital walls are intact. No entrapment of the inferior rectus muscle is identified. Orbits: Right orbital floor fracture. No other orbital wall fractures are identified. The globes are intact. Sinuses: Hemorrhage and fluid in the right maxillary sinus and scattered right ethmoid sinuses. There is also mucoperiosteal thickening involving the right half of the sphenoid sinus and mild sinus wall thickening suggesting chronic changes. Soft tissues: Sizable right frontal and periorbital hematoma. CT CERVICAL SPINE FINDINGS Alignment: Degenerative cervical spondylosis with degenerative subluxations but the overall alignment is maintained. Skull base and vertebrae: No cervical spine fractures are identified. The facets are normally aligned. No facet or laminar fractures. Soft tissues and spinal canal: No prevertebral fluid or swelling. No visible canal hematoma. Disc levels: Degenerative cervical spondylosis with multilevel disc disease and facet disease but no large disc protrusion or significant canal compromise. Upper chest: The lung apices are grossly clear. Other: Indeterminate 19 mm right thyroid lobe lesion. Recommend thyroid US (ref: J Am Coll Radiol. 2015 Feb;12(2): 143-50). IMPRESSION: 1. Small hemorrhagic parenchymal brain contusions involving the right frontal lobe and right temporal lobe. No epidural, subdural or subarachnoid hemorrhage. No mass effect or shift. 2. Age related cerebral atrophy, ventriculomegaly and periventricular white matter disease. Remote lacunar type basal ganglia infarcts. 3. Right orbital floor fracture and right maxillary sinus wall fractures as detailed above. No other facial bone fractures are identified. 4. Right frontal scalp and right periorbital hematomas. 5. No cervical spine fractures. Electronically Signed   By: Marijo Sanes M.D.   On: 12/21/2021 12:50   CT Maxillofacial Wo  Contrast  Result Date: 12/21/2021 CLINICAL DATA:  [place] today. Right-sided facial trauma. Stepped CT with EXAM: CT HEAD WITHOUT CONTRAST CT MAXILLOFACIAL WITHOUT CONTRAST CT CERVICAL SPINE WITHOUT CONTRAST TECHNIQUE: Multidetector CT imaging of the head, cervical spine, and maxillofacial structures were performed using the standard protocol without intravenous contrast. Multiplanar CT image reconstructions of the cervical spine and maxillofacial structures were also generated. RADIATION DOSE REDUCTION: This exam was performed according to the departmental dose-optimization program which includes automated exposure control, adjustment of the mA and/or kV according to patient size and/or use of iterative reconstruction technique. COMPARISON:  None Available. FINDINGS: CT HEAD FINDINGS Brain: There is a focal hemorrhagic contusion in the right temporal lobe. Smaller parenchymal hemorrhagic contusions in the right frontal lobe. No subdural or subarachnoid hemorrhage is identified. No mass effect or midline shift. The brainstem and cerebellum are unremarkable. Age related cerebral atrophy, mild ventriculomegaly and periventricular white matter disease. Remote appearing basal ganglia lacunar type infarcts are noted. Vascular: Scattered vascular calcifications but no aneurysm or hyperdense vessels. Skull: No skull fracture or bone lesion.  There is a fracture of the right maxillary sinus anterior wall better seen on the facial CT. There is moderate hematoma in the right maxillary sinus. Other: Right frontal scalp hematoma. CT MAXILLOFACIAL FINDINGS Osseous: Mildly depressed fracture involving the anterior wall the right maxillary sinus with maximum depression of approximately 4 mm. Suspect small fractures involving the lateral wall also with adjacent air in the soft tissues. There is a displaced orbital floor fracture on the right with a trapped door appearance. Maximum depression is 5 mm. The other orbital walls are  intact. No entrapment of the inferior rectus muscle is identified. Orbits: Right orbital floor fracture. No other orbital wall fractures are identified. The globes are intact. Sinuses: Hemorrhage and fluid in the right maxillary sinus and scattered right ethmoid sinuses. There is also mucoperiosteal thickening involving the right half of the sphenoid sinus and mild sinus wall thickening suggesting chronic changes. Soft tissues: Sizable right frontal and periorbital hematoma. CT CERVICAL SPINE FINDINGS Alignment: Degenerative cervical spondylosis with degenerative subluxations but the overall alignment is maintained. Skull base and vertebrae: No cervical spine fractures are identified. The facets are normally aligned. No facet or laminar fractures. Soft tissues and spinal canal: No prevertebral fluid or swelling. No visible canal hematoma. Disc levels: Degenerative cervical spondylosis with multilevel disc disease and facet disease but no large disc protrusion or significant canal compromise. Upper chest: The lung apices are grossly clear. Other: Indeterminate 19 mm right thyroid lobe lesion. Recommend thyroid US (ref: J Am Coll Radiol. 2015 Feb;12(2): 143-50). IMPRESSION: 1. Small hemorrhagic parenchymal brain contusions involving the right frontal lobe and right temporal lobe. No epidural, subdural or subarachnoid hemorrhage. No mass effect or shift. 2. Age related cerebral atrophy, ventriculomegaly and periventricular white matter disease. Remote lacunar type basal ganglia infarcts. 3. Right orbital floor fracture and right maxillary sinus wall fractures as detailed above. No other facial bone fractures are identified. 4. Right frontal scalp and right periorbital hematomas. 5. No cervical spine fractures. Electronically Signed   By: Marijo Sanes M.D.   On: 12/21/2021 12:50   DG Chest Portable 1 View  Result Date: 12/21/2021 CLINICAL DATA:  Fall. EXAM: PORTABLE CHEST 1 VIEW COMPARISON:  One-view chest x-ray  03/10/2011 FINDINGS: Heart is enlarged, exaggerated by low lung volumes. Atherosclerotic calcifications are present at the arch. Lungs are clear. Right shoulder dislocation noted. No definite fracture. IMPRESSION: 1. Right shoulder dislocation. 2. No acute cardiopulmonary disease. 3. Cardiomegaly without failure. 4. Aortic atherosclerosis. Electronically Signed   By: San Morelle M.D.   On: 12/21/2021 12:27   DG Shoulder Left  Result Date: 12/21/2021 CLINICAL DATA:  Trauma, fall, pain EXAM: LEFT SHOULDER - 2+ VIEW COMPARISON:  None Available. FINDINGS: No recent fracture is seen. Osteopenia is seen in bony structures. There is decreased distance between acromion and humeral head, possibly suggesting a chronic tear of rotator cuff. Small bony spurs are noted. IMPRESSION: No fracture or dislocation is seen. Degenerative changes are noted with bony spurs. Decreased distance between humeral head and acromion may suggest chronic tear of rotator cuff. Electronically Signed   By: Elmer Picker M.D.   On: 12/21/2021 12:23   DG Shoulder Right  Result Date: 12/21/2021 CLINICAL DATA:  Status post fall, right shoulder dislocation, right shoulder pain EXAM: RIGHT SHOULDER - 2+ VIEW COMPARISON:  None Available. FINDINGS: Generalized osteopenia. Anterior inferior shoulder dislocation. Small bony fragment along the superior glenoid which may reflect a small fracture fragment. No other fracture or dislocation. IMPRESSION: 1. Anterior inferior  shoulder dislocation. Electronically Signed   By: Kathreen Devoid M.D.   On: 12/21/2021 12:15    Procedures .Sedation  Date/Time: 12/21/2021 4:20 PM  Performed by: Fredia Sorrow, MD Authorized by: Fredia Sorrow, MD   Consent:    Consent obtained:  Verbal and written   Consent given by:  Patient   Risks discussed:  Allergic reaction, dysrhythmia, prolonged sedation necessitating reversal, prolonged hypoxia resulting in organ damage, inadequate sedation,  respiratory compromise necessitating ventilatory assistance and intubation, nausea and vomiting   Alternatives discussed:  Analgesia without sedation Universal protocol:    Procedure explained and questions answered to patient or proxy's satisfaction: yes     Relevant documents present and verified: yes     Test results available: yes     Imaging studies available: yes     Required blood products, implants, devices, and special equipment available: yes     Site/side marked: no     Immediately prior to procedure, a time out was called: yes     Patient identity confirmed:  Arm band and verbally with patient Indications:    Procedure performed:  Dislocation reduction   Procedure necessitating sedation performed by:  Physician performing sedation Pre-sedation assessment:    Time since last food or drink:  12 hours ago   ASA classification: class 2 - patient with mild systemic disease     Mouth opening:  3 or more finger widths   Mallampati score:  I - soft palate, uvula, fauces, pillars visible   Neck mobility: normal     Pre-sedation assessments completed and reviewed: airway patency, cardiovascular function, mental status, pain level and respiratory function     Pre-sedation assessments completed and reviewed: pre-procedure nausea and vomiting status not reviewed   Immediate pre-procedure details:    Reassessment: Patient reassessed immediately prior to procedure     Reviewed: vital signs     Verified: emergency equipment available, IV patency confirmed, oxygen available and suction available   Procedure details (see MAR for exact dosages):    Preoxygenation:  Nasal cannula   Sedation:  Alfentanil   Intended level of sedation: deep   Analgesia:  Fentanyl   Intra-procedure monitoring:  Blood pressure monitoring, cardiac monitor, continuous capnometry, continuous pulse oximetry, frequent LOC assessments and frequent vital sign checks   Intra-procedure events: none     Total Provider  sedation time (minutes):  20 Post-procedure details:    Attendance: Constant attendance by certified staff until patient recovered     Recovery: Patient returned to pre-procedure baseline     Post-sedation assessments completed and reviewed: airway patency, cardiovascular function, mental status and respiratory function     Post-sedation assessments completed and reviewed: post-procedure nausea and vomiting status not reviewed     Patient is stable for discharge or admission: yes     Procedure completion:  Tolerated well, no immediate complications Reduction of dislocation  Date/Time: 12/21/2021 4:25 PM  Performed by: Fredia Sorrow, MD Authorized by: Fredia Sorrow, MD  Consent: Verbal consent obtained. Written consent obtained. Consent given by: patient Patient understanding: patient states understanding of the procedure being performed Patient consent: the patient's understanding of the procedure matches consent given Procedure consent: procedure consent matches procedure scheduled Relevant documents: relevant documents present and verified Test results: test results available and properly labeled Site marked: the operative site was marked Imaging studies: imaging studies available Patient identity confirmed: verbally with patient Time out: Immediately prior to procedure a "time out" was called to verify the correct patient,  procedure, equipment, support staff and site/side marked as required. Local anesthesia used: no  Anesthesia: Local anesthesia used: no  Sedation: Patient sedated: yes Sedatives: etomidate  Patient tolerance: patient tolerated the procedure well with no immediate complications Comments: Follow-up x-rays show good reduction shoulder is no longer dislocated.       Medications Ordered in ED Medications  fentaNYL (SUBLIMAZE) injection 25 mcg (25 mcg Intravenous Given 12/21/21 1207)  fentaNYL (SUBLIMAZE) injection 25 mcg (25 mcg Intravenous Given 12/21/21  1259)  etomidate (AMIDATE) injection (5 mg Intravenous Given 12/21/21 1355)  fentaNYL (SUBLIMAZE) injection 50 mcg (50 mcg Intravenous Given 12/21/21 1354)    ED Course/ Medical Decision Making/ A&P                           Medical Decision Making Amount and/or Complexity of Data Reviewed Labs: ordered. Radiology: ordered.  Risk Prescription drug management.   Work-up for the fall.  Basic metabolic panel without any acute findings.  CBC White blood cell count is 11.9 hemoglobin 12.9 platelets normal at 174.  Patient is not on any blood thinners.  X-ray of the right shoulder and chest x-ray only show right shoulder dislocation.  No evidence of any fracture.  Chest x-ray otherwise negative.  Head CT showed small hemorrhagic cortical brain contusion involving the right frontal lobe and right temporal area.  No epidural subdural subarachnoid hemorrhage no mass effect or shift.  There is a right orbital floor fracture and right maxillary sinus wall fractures as detailed above.  No other facial bones are identified. CT cervical spine without any acute injury..  Head CT findings discussed with Dr. Arnoldo Morale from neurosurgery felt patient was safe for discharge home and has family members that can stay with her.  If headache persist they will see her in the office.  Orbital floor fracture and maxillary sinus fracture will require follow-up with ear nose and throat.  Dr. Janace Hoard information provided.  Family understands important to make sure this gets repaired.  No evidence of any entrapment of the ocular muscles at this time.  No active bleeding from the nose.  The shoulder was reduced here after conscious sedation with etomidate.  Patient placed in shoulder immobilizer and patient will follow-up with sports medicine patient been seen by them before for.  That will be at [facility].  Appropriate reduction of the shoulder confirmed by x-ray.    Final Clinical Impression(s) / ED  Diagnoses Final diagnoses:  Fall, initial encounter  Injury of head, initial encounter  Contusion of right cerebral hemisphere, with unknown loss of consciousness status, initial encounter ([facility])  Closed fracture of orbit, initial encounter [facility])    Rx / DC Orders ED Discharge Orders          Ordered    HYDROcodone-acetaminophen (NORCO/VICODIN) 5-325 MG tablet  Every 6 hours PRN        12/21/21 1557              Fredia Sorrow, MD 12/21/21 1627

## 2021-12-26 ENCOUNTER — Telehealth: Payer: Self-pay

## 2021-12-26 NOTE — Telephone Encounter (Signed)
Ahhhh....sorry!  Maybe try Dr. Raeford Razor?

## 2021-12-26 NOTE — Telephone Encounter (Signed)
Kristin Romero son Kristin Romero called and states she had a fall. He states she really needs to be seen this week. He wanted to know if she can be worked in.   She was seen at a [facility] ED after the fall.

## 2021-12-27 ENCOUNTER — Ambulatory Visit: Payer: Medicare Other | Admitting: Sports Medicine

## 2021-12-27 ENCOUNTER — Other Ambulatory Visit: Payer: Self-pay | Admitting: Otolaryngology

## 2021-12-27 DIAGNOSIS — W19XXXA Unspecified fall, initial encounter: Secondary | ICD-10-CM | POA: Diagnosis not present

## 2021-12-27 DIAGNOSIS — Z658 Other specified problems related to psychosocial circumstances: Secondary | ICD-10-CM | POA: Diagnosis not present

## 2021-12-27 DIAGNOSIS — M7060 Trochanteric bursitis, unspecified hip: Secondary | ICD-10-CM | POA: Diagnosis not present

## 2021-12-27 DIAGNOSIS — I10 Essential (primary) hypertension: Secondary | ICD-10-CM | POA: Diagnosis not present

## 2021-12-27 DIAGNOSIS — E041 Nontoxic single thyroid nodule: Secondary | ICD-10-CM

## 2021-12-27 DIAGNOSIS — S0231XD Fracture of orbital floor, right side, subsequent encounter for fracture with routine healing: Secondary | ICD-10-CM | POA: Diagnosis not present

## 2021-12-27 DIAGNOSIS — S43006A Unspecified dislocation of unspecified shoulder joint, initial encounter: Secondary | ICD-10-CM | POA: Diagnosis not present

## 2021-12-27 NOTE — Telephone Encounter (Signed)
Patient has already been scheduled

## 2021-12-28 ENCOUNTER — Ambulatory Visit (INDEPENDENT_AMBULATORY_CARE_PROVIDER_SITE_OTHER): Payer: Medicare Other

## 2021-12-28 ENCOUNTER — Other Ambulatory Visit (HOSPITAL_BASED_OUTPATIENT_CLINIC_OR_DEPARTMENT_OTHER): Payer: Self-pay

## 2021-12-28 ENCOUNTER — Encounter: Payer: Self-pay | Admitting: Sports Medicine

## 2021-12-28 ENCOUNTER — Ambulatory Visit (INDEPENDENT_AMBULATORY_CARE_PROVIDER_SITE_OTHER): Payer: Medicare Other | Admitting: Sports Medicine

## 2021-12-28 DIAGNOSIS — S43004A Unspecified dislocation of right shoulder joint, initial encounter: Secondary | ICD-10-CM

## 2021-12-28 DIAGNOSIS — S32511A Fracture of superior rim of right pubis, initial encounter for closed fracture: Secondary | ICD-10-CM | POA: Diagnosis not present

## 2021-12-28 DIAGNOSIS — M160 Bilateral primary osteoarthritis of hip: Secondary | ICD-10-CM

## 2021-12-28 DIAGNOSIS — M25551 Pain in right hip: Secondary | ICD-10-CM | POA: Diagnosis not present

## 2021-12-28 MED ORDER — HYDROCODONE-ACETAMINOPHEN 10-325 MG PO TABS
1.0000 | ORAL_TABLET | Freq: Three times a day (TID) | ORAL | 0 refills | Status: DC | PRN
  Filled 2021-12-28: qty 30, 10d supply, fill #0

## 2021-12-28 MED ORDER — HYDROCODONE-ACETAMINOPHEN 10-325 MG PO TABS: 1.0000 | ORAL_TABLET | Freq: Three times a day (TID) | ORAL | 0 refills | Status: DC | PRN

## 2021-12-28 NOTE — Assessment & Plan Note (Signed)
Pleasant 71 year old female, fall week ago with polytrauma including maxillary fractures, shoulder dislocation. She does have severe hip pain referable to the joint, ran out of Percocet from the ED. I am happy to refill this with 10 mg formulation. Injected right hip today. Updated x-rays. Home health physical therapy. Return to see me in 6 weeks.

## 2021-12-28 NOTE — Assessment & Plan Note (Signed)
Recent fall with recurrent (third) dislocation of the right shoulder, reduced in the ED, looks like there is an avulsion fracture. Motion is good, she will continue sling for another week and then start home health physical therapy.

## 2021-12-28 NOTE — Addendum Note (Signed)
Addended by: Silverio Decamp on: 12/28/2021 09:28 AM   Modules accepted: Orders

## 2021-12-28 NOTE — Progress Notes (Signed)
    Procedures performed today:    Procedure: Real-time Ultrasound Guided injection of the right hip joint Device: Samsung HS60  Verbal informed consent obtained.  Time-out conducted.  Noted no overlying erythema, induration, or other signs of local infection.  Skin prepped in a sterile fashion.  Local anesthesia: Topical Ethyl chloride.  With sterile technique and under real time ultrasound guidance: Severely arthritic joint noted, 1 cc Kenalog 40, 2 cc lidocaine, 2 cc bupivacaine injected easily Completed without difficulty  Advised to call if fevers/chills, erythema, induration, drainage, or persistent bleeding.  Images permanently stored and available for review in PACS.  Impression: Technically successful ultrasound guided injection.  Independent interpretation of notes and tests performed by another provider:   None.  Brief History, Exam, Impression, and Recommendations:    Dislocation of shoulder, right, closed Recent fall with recurrent (third) dislocation of the right shoulder, reduced in the ED, looks like there is an avulsion fracture. Motion is good, she will continue sling for another week and then start home health physical therapy.   Primary osteoarthritis of both hips Pleasant 71 year old female, fall week ago with polytrauma including maxillary fractures, shoulder dislocation. She does have severe hip pain referable to the joint, ran out of Percocet from the ED. I am happy to refill this with 10 mg formulation. Injected right hip today. Updated x-rays. Home health physical therapy. Return to see me in 6 weeks.  I spent 40 minutes of total time managing this patient today, this includes chart review, face to face, and non-face to face time.  This was separate from the time spent performing the procedure.  ____________________________________________ Gwen Her. Dianah Field, M.D., ABFM., CAQSM., AME. Primary Care and Sports Medicine [facility]  [facility]  Adjunct Professor of [place] of [facility] [employer]  Risk manager

## 2021-12-30 DIAGNOSIS — M160 Bilateral primary osteoarthritis of hip: Secondary | ICD-10-CM | POA: Diagnosis not present

## 2021-12-30 DIAGNOSIS — S0511XD Contusion of eyeball and orbital tissues, right eye, subsequent encounter: Secondary | ICD-10-CM | POA: Diagnosis not present

## 2021-12-30 DIAGNOSIS — R220 Localized swelling, mass and lump, head: Secondary | ICD-10-CM | POA: Diagnosis not present

## 2021-12-30 DIAGNOSIS — Z79891 Long term (current) use of opiate analgesic: Secondary | ICD-10-CM | POA: Diagnosis not present

## 2021-12-30 DIAGNOSIS — I10 Essential (primary) hypertension: Secondary | ICD-10-CM | POA: Diagnosis not present

## 2021-12-30 DIAGNOSIS — Z9181 History of falling: Secondary | ICD-10-CM | POA: Diagnosis not present

## 2021-12-30 DIAGNOSIS — E041 Nontoxic single thyroid nodule: Secondary | ICD-10-CM | POA: Diagnosis not present

## 2021-12-30 DIAGNOSIS — S43004D Unspecified dislocation of right shoulder joint, subsequent encounter: Secondary | ICD-10-CM | POA: Diagnosis not present

## 2021-12-30 DIAGNOSIS — M8580 Other specified disorders of bone density and structure, unspecified site: Secondary | ICD-10-CM | POA: Diagnosis not present

## 2021-12-30 DIAGNOSIS — M47812 Spondylosis without myelopathy or radiculopathy, cervical region: Secondary | ICD-10-CM | POA: Diagnosis not present

## 2021-12-30 DIAGNOSIS — S0231XD Fracture of orbital floor, right side, subsequent encounter for fracture with routine healing: Secondary | ICD-10-CM | POA: Diagnosis not present

## 2021-12-30 DIAGNOSIS — I700 Atherosclerosis of aorta: Secondary | ICD-10-CM | POA: Diagnosis not present

## 2021-12-30 DIAGNOSIS — S0083XD Contusion of other part of head, subsequent encounter: Secondary | ICD-10-CM | POA: Diagnosis not present

## 2021-12-30 DIAGNOSIS — S0240CD Maxillary fracture, right side, subsequent encounter for fracture with routine healing: Secondary | ICD-10-CM | POA: Diagnosis not present

## 2021-12-30 DIAGNOSIS — Z658 Other specified problems related to psychosocial circumstances: Secondary | ICD-10-CM | POA: Diagnosis not present

## 2021-12-30 DIAGNOSIS — M7061 Trochanteric bursitis, right hip: Secondary | ICD-10-CM | POA: Diagnosis not present

## 2022-01-02 DIAGNOSIS — S43004D Unspecified dislocation of right shoulder joint, subsequent encounter: Secondary | ICD-10-CM | POA: Diagnosis not present

## 2022-01-02 DIAGNOSIS — M7061 Trochanteric bursitis, right hip: Secondary | ICD-10-CM | POA: Diagnosis not present

## 2022-01-02 DIAGNOSIS — I10 Essential (primary) hypertension: Secondary | ICD-10-CM | POA: Diagnosis not present

## 2022-01-02 DIAGNOSIS — M160 Bilateral primary osteoarthritis of hip: Secondary | ICD-10-CM | POA: Diagnosis not present

## 2022-01-02 DIAGNOSIS — E041 Nontoxic single thyroid nodule: Secondary | ICD-10-CM | POA: Diagnosis not present

## 2022-01-02 DIAGNOSIS — S0231XD Fracture of orbital floor, right side, subsequent encounter for fracture with routine healing: Secondary | ICD-10-CM | POA: Diagnosis not present

## 2022-01-04 DIAGNOSIS — M7061 Trochanteric bursitis, right hip: Secondary | ICD-10-CM | POA: Diagnosis not present

## 2022-01-04 DIAGNOSIS — M160 Bilateral primary osteoarthritis of hip: Secondary | ICD-10-CM | POA: Diagnosis not present

## 2022-01-04 DIAGNOSIS — S0231XD Fracture of orbital floor, right side, subsequent encounter for fracture with routine healing: Secondary | ICD-10-CM | POA: Diagnosis not present

## 2022-01-04 DIAGNOSIS — E041 Nontoxic single thyroid nodule: Secondary | ICD-10-CM | POA: Diagnosis not present

## 2022-01-04 DIAGNOSIS — S43004D Unspecified dislocation of right shoulder joint, subsequent encounter: Secondary | ICD-10-CM | POA: Diagnosis not present

## 2022-01-04 DIAGNOSIS — I10 Essential (primary) hypertension: Secondary | ICD-10-CM | POA: Diagnosis not present

## 2022-01-09 DIAGNOSIS — S43004D Unspecified dislocation of right shoulder joint, subsequent encounter: Secondary | ICD-10-CM | POA: Diagnosis not present

## 2022-01-09 DIAGNOSIS — I10 Essential (primary) hypertension: Secondary | ICD-10-CM | POA: Diagnosis not present

## 2022-01-09 DIAGNOSIS — E041 Nontoxic single thyroid nodule: Secondary | ICD-10-CM | POA: Diagnosis not present

## 2022-01-09 DIAGNOSIS — M160 Bilateral primary osteoarthritis of hip: Secondary | ICD-10-CM | POA: Diagnosis not present

## 2022-01-09 DIAGNOSIS — M7061 Trochanteric bursitis, right hip: Secondary | ICD-10-CM | POA: Diagnosis not present

## 2022-01-09 DIAGNOSIS — S0231XD Fracture of orbital floor, right side, subsequent encounter for fracture with routine healing: Secondary | ICD-10-CM | POA: Diagnosis not present

## 2022-01-11 DIAGNOSIS — M7061 Trochanteric bursitis, right hip: Secondary | ICD-10-CM | POA: Diagnosis not present

## 2022-01-11 DIAGNOSIS — S43004D Unspecified dislocation of right shoulder joint, subsequent encounter: Secondary | ICD-10-CM | POA: Diagnosis not present

## 2022-01-11 DIAGNOSIS — E041 Nontoxic single thyroid nodule: Secondary | ICD-10-CM | POA: Diagnosis not present

## 2022-01-11 DIAGNOSIS — M160 Bilateral primary osteoarthritis of hip: Secondary | ICD-10-CM | POA: Diagnosis not present

## 2022-01-11 DIAGNOSIS — S0231XD Fracture of orbital floor, right side, subsequent encounter for fracture with routine healing: Secondary | ICD-10-CM | POA: Diagnosis not present

## 2022-01-11 DIAGNOSIS — I10 Essential (primary) hypertension: Secondary | ICD-10-CM | POA: Diagnosis not present

## 2022-01-17 ENCOUNTER — Telehealth: Payer: Self-pay

## 2022-01-17 ENCOUNTER — Ambulatory Visit: Payer: Medicare Other | Admitting: Sports Medicine

## 2022-01-17 NOTE — Telephone Encounter (Signed)
Samul Dada with [facility] called to report that patient has requested that they discontinue nursing services but she will continue with PT.

## 2022-01-19 DIAGNOSIS — S43004D Unspecified dislocation of right shoulder joint, subsequent encounter: Secondary | ICD-10-CM | POA: Diagnosis not present

## 2022-01-19 DIAGNOSIS — E041 Nontoxic single thyroid nodule: Secondary | ICD-10-CM | POA: Diagnosis not present

## 2022-01-19 DIAGNOSIS — M7061 Trochanteric bursitis, right hip: Secondary | ICD-10-CM | POA: Diagnosis not present

## 2022-01-19 DIAGNOSIS — M160 Bilateral primary osteoarthritis of hip: Secondary | ICD-10-CM | POA: Diagnosis not present

## 2022-01-19 DIAGNOSIS — I10 Essential (primary) hypertension: Secondary | ICD-10-CM | POA: Diagnosis not present

## 2022-01-19 DIAGNOSIS — S0231XD Fracture of orbital floor, right side, subsequent encounter for fracture with routine healing: Secondary | ICD-10-CM | POA: Diagnosis not present

## 2022-01-28 DIAGNOSIS — M7061 Trochanteric bursitis, right hip: Secondary | ICD-10-CM | POA: Diagnosis not present

## 2022-01-28 DIAGNOSIS — E041 Nontoxic single thyroid nodule: Secondary | ICD-10-CM | POA: Diagnosis not present

## 2022-01-28 DIAGNOSIS — M160 Bilateral primary osteoarthritis of hip: Secondary | ICD-10-CM | POA: Diagnosis not present

## 2022-01-28 DIAGNOSIS — S43004D Unspecified dislocation of right shoulder joint, subsequent encounter: Secondary | ICD-10-CM | POA: Diagnosis not present

## 2022-01-28 DIAGNOSIS — S0231XD Fracture of orbital floor, right side, subsequent encounter for fracture with routine healing: Secondary | ICD-10-CM | POA: Diagnosis not present

## 2022-01-28 DIAGNOSIS — I10 Essential (primary) hypertension: Secondary | ICD-10-CM | POA: Diagnosis not present

## 2022-03-31 ENCOUNTER — Telehealth: Payer: Medicare Other | Admitting: Emergency Medicine

## 2022-03-31 DIAGNOSIS — N3001 Acute cystitis with hematuria: Secondary | ICD-10-CM | POA: Diagnosis not present

## 2022-03-31 DIAGNOSIS — R300 Dysuria: Secondary | ICD-10-CM

## 2022-03-31 NOTE — Progress Notes (Signed)
Hello Cindy,   I am so sorry I am not able to help you while you are in [facility]. Our healthcare providers are licensed in [place] and [place] only.   I suggest you look for a local [place] urgent care to go to. Or you can try and online video visit with [facility] while you are out of state - this department has providers for other states:   [email or web address]  Kind regards,  Willeen Cass, PhD, FNP-BC [place]

## 2022-03-31 NOTE — Addendum Note (Signed)
Addended by: Carvel Getting on: 03/31/2022 02:24 PM   Modules accepted: Level of Service

## 2022-04-12 DIAGNOSIS — M1611 Unilateral primary osteoarthritis, right hip: Secondary | ICD-10-CM | POA: Diagnosis not present

## 2022-04-12 DIAGNOSIS — S32810D Multiple fractures of pelvis with stable disruption of pelvic ring, subsequent encounter for fracture with routine healing: Secondary | ICD-10-CM | POA: Diagnosis not present

## 2022-04-28 DIAGNOSIS — Z01810 Encounter for preprocedural cardiovascular examination: Secondary | ICD-10-CM | POA: Diagnosis not present

## 2022-04-28 DIAGNOSIS — I10 Essential (primary) hypertension: Secondary | ICD-10-CM | POA: Diagnosis not present

## 2022-04-28 DIAGNOSIS — R791 Abnormal coagulation profile: Secondary | ICD-10-CM | POA: Diagnosis not present

## 2022-04-28 DIAGNOSIS — Z8673 Personal history of transient ischemic attack (TIA), and cerebral infarction without residual deficits: Secondary | ICD-10-CM | POA: Diagnosis not present

## 2022-05-02 DIAGNOSIS — Z01812 Encounter for preprocedural laboratory examination: Secondary | ICD-10-CM | POA: Diagnosis not present

## 2022-05-02 DIAGNOSIS — Z79899 Other long term (current) drug therapy: Secondary | ICD-10-CM | POA: Diagnosis not present

## 2022-05-08 DIAGNOSIS — M1611 Unilateral primary osteoarthritis, right hip: Secondary | ICD-10-CM | POA: Diagnosis not present

## 2022-05-08 DIAGNOSIS — N390 Urinary tract infection, site not specified: Secondary | ICD-10-CM | POA: Diagnosis not present

## 2022-05-11 DIAGNOSIS — I10 Essential (primary) hypertension: Secondary | ICD-10-CM | POA: Diagnosis not present

## 2022-05-11 DIAGNOSIS — N390 Urinary tract infection, site not specified: Secondary | ICD-10-CM | POA: Diagnosis not present

## 2022-05-11 DIAGNOSIS — M1990 Unspecified osteoarthritis, unspecified site: Secondary | ICD-10-CM | POA: Diagnosis not present

## 2022-05-11 DIAGNOSIS — Z0389 Encounter for observation for other suspected diseases and conditions ruled out: Secondary | ICD-10-CM | POA: Diagnosis not present

## 2022-05-11 DIAGNOSIS — F32A Depression, unspecified: Secondary | ICD-10-CM | POA: Diagnosis not present

## 2022-05-11 DIAGNOSIS — Z96641 Presence of right artificial hip joint: Secondary | ICD-10-CM | POA: Diagnosis not present

## 2022-05-11 DIAGNOSIS — F419 Anxiety disorder, unspecified: Secondary | ICD-10-CM | POA: Diagnosis not present

## 2022-05-11 DIAGNOSIS — G8918 Other acute postprocedural pain: Secondary | ICD-10-CM | POA: Diagnosis not present

## 2022-05-11 DIAGNOSIS — M1611 Unilateral primary osteoarthritis, right hip: Secondary | ICD-10-CM | POA: Diagnosis not present

## 2022-05-11 DIAGNOSIS — Z79899 Other long term (current) drug therapy: Secondary | ICD-10-CM | POA: Diagnosis not present

## 2022-05-12 DIAGNOSIS — Z79899 Other long term (current) drug therapy: Secondary | ICD-10-CM | POA: Diagnosis not present

## 2022-05-12 DIAGNOSIS — F32A Depression, unspecified: Secondary | ICD-10-CM | POA: Diagnosis not present

## 2022-05-12 DIAGNOSIS — M1990 Unspecified osteoarthritis, unspecified site: Secondary | ICD-10-CM | POA: Diagnosis not present

## 2022-05-12 DIAGNOSIS — F419 Anxiety disorder, unspecified: Secondary | ICD-10-CM | POA: Diagnosis not present

## 2022-05-12 DIAGNOSIS — I10 Essential (primary) hypertension: Secondary | ICD-10-CM | POA: Diagnosis not present

## 2022-05-12 DIAGNOSIS — N390 Urinary tract infection, site not specified: Secondary | ICD-10-CM | POA: Diagnosis not present

## 2022-05-12 DIAGNOSIS — M1611 Unilateral primary osteoarthritis, right hip: Secondary | ICD-10-CM | POA: Diagnosis not present

## 2022-05-13 DIAGNOSIS — Z96641 Presence of right artificial hip joint: Secondary | ICD-10-CM | POA: Diagnosis not present

## 2022-05-13 DIAGNOSIS — Z471 Aftercare following joint replacement surgery: Secondary | ICD-10-CM | POA: Diagnosis not present

## 2022-05-13 DIAGNOSIS — F419 Anxiety disorder, unspecified: Secondary | ICD-10-CM | POA: Diagnosis not present

## 2022-05-13 DIAGNOSIS — I10 Essential (primary) hypertension: Secondary | ICD-10-CM | POA: Diagnosis not present

## 2022-05-13 DIAGNOSIS — Z79891 Long term (current) use of opiate analgesic: Secondary | ICD-10-CM | POA: Diagnosis not present

## 2022-05-13 DIAGNOSIS — Z4801 Encounter for change or removal of surgical wound dressing: Secondary | ICD-10-CM | POA: Diagnosis not present

## 2022-05-13 DIAGNOSIS — F32A Depression, unspecified: Secondary | ICD-10-CM | POA: Diagnosis not present

## 2022-05-13 DIAGNOSIS — Z7982 Long term (current) use of aspirin: Secondary | ICD-10-CM | POA: Diagnosis not present

## 2022-05-15 DIAGNOSIS — F419 Anxiety disorder, unspecified: Secondary | ICD-10-CM | POA: Diagnosis not present

## 2022-05-15 DIAGNOSIS — F32A Depression, unspecified: Secondary | ICD-10-CM | POA: Diagnosis not present

## 2022-05-15 DIAGNOSIS — Z4801 Encounter for change or removal of surgical wound dressing: Secondary | ICD-10-CM | POA: Diagnosis not present

## 2022-05-15 DIAGNOSIS — I10 Essential (primary) hypertension: Secondary | ICD-10-CM | POA: Diagnosis not present

## 2022-05-15 DIAGNOSIS — Z471 Aftercare following joint replacement surgery: Secondary | ICD-10-CM | POA: Diagnosis not present

## 2022-05-15 DIAGNOSIS — Z96641 Presence of right artificial hip joint: Secondary | ICD-10-CM | POA: Diagnosis not present

## 2022-05-16 DIAGNOSIS — I10 Essential (primary) hypertension: Secondary | ICD-10-CM | POA: Diagnosis not present

## 2022-05-16 DIAGNOSIS — Z471 Aftercare following joint replacement surgery: Secondary | ICD-10-CM | POA: Diagnosis not present

## 2022-05-16 DIAGNOSIS — Z4801 Encounter for change or removal of surgical wound dressing: Secondary | ICD-10-CM | POA: Diagnosis not present

## 2022-05-16 DIAGNOSIS — F419 Anxiety disorder, unspecified: Secondary | ICD-10-CM | POA: Diagnosis not present

## 2022-05-16 DIAGNOSIS — Z96641 Presence of right artificial hip joint: Secondary | ICD-10-CM | POA: Diagnosis not present

## 2022-05-16 DIAGNOSIS — F32A Depression, unspecified: Secondary | ICD-10-CM | POA: Diagnosis not present

## 2022-05-18 DIAGNOSIS — Z96641 Presence of right artificial hip joint: Secondary | ICD-10-CM | POA: Diagnosis not present

## 2022-05-18 DIAGNOSIS — F419 Anxiety disorder, unspecified: Secondary | ICD-10-CM | POA: Diagnosis not present

## 2022-05-18 DIAGNOSIS — F32A Depression, unspecified: Secondary | ICD-10-CM | POA: Diagnosis not present

## 2022-05-18 DIAGNOSIS — I10 Essential (primary) hypertension: Secondary | ICD-10-CM | POA: Diagnosis not present

## 2022-05-18 DIAGNOSIS — Z4801 Encounter for change or removal of surgical wound dressing: Secondary | ICD-10-CM | POA: Diagnosis not present

## 2022-05-18 DIAGNOSIS — Z471 Aftercare following joint replacement surgery: Secondary | ICD-10-CM | POA: Diagnosis not present

## 2022-05-22 DIAGNOSIS — S72001A Fracture of unspecified part of neck of right femur, initial encounter for closed fracture: Secondary | ICD-10-CM | POA: Diagnosis not present

## 2022-05-22 DIAGNOSIS — Z7982 Long term (current) use of aspirin: Secondary | ICD-10-CM | POA: Diagnosis not present

## 2022-05-22 DIAGNOSIS — Z741 Need for assistance with personal care: Secondary | ICD-10-CM | POA: Diagnosis not present

## 2022-05-22 DIAGNOSIS — Z01818 Encounter for other preprocedural examination: Secondary | ICD-10-CM | POA: Diagnosis not present

## 2022-05-22 DIAGNOSIS — K5900 Constipation, unspecified: Secondary | ICD-10-CM | POA: Diagnosis not present

## 2022-05-22 DIAGNOSIS — M6281 Muscle weakness (generalized): Secondary | ICD-10-CM | POA: Diagnosis not present

## 2022-05-22 DIAGNOSIS — S728X1A Other fracture of right femur, initial encounter for closed fracture: Secondary | ICD-10-CM | POA: Diagnosis not present

## 2022-05-22 DIAGNOSIS — Z789 Other specified health status: Secondary | ICD-10-CM | POA: Diagnosis not present

## 2022-05-22 DIAGNOSIS — F4322 Adjustment disorder with anxiety: Secondary | ICD-10-CM | POA: Diagnosis not present

## 2022-05-22 DIAGNOSIS — M1990 Unspecified osteoarthritis, unspecified site: Secondary | ICD-10-CM | POA: Diagnosis not present

## 2022-05-22 DIAGNOSIS — I10 Essential (primary) hypertension: Secondary | ICD-10-CM | POA: Diagnosis not present

## 2022-05-22 DIAGNOSIS — M9701XD Periprosthetic fracture around internal prosthetic right hip joint, subsequent encounter: Secondary | ICD-10-CM | POA: Diagnosis not present

## 2022-05-22 DIAGNOSIS — F419 Anxiety disorder, unspecified: Secondary | ICD-10-CM | POA: Diagnosis not present

## 2022-05-22 DIAGNOSIS — M1611 Unilateral primary osteoarthritis, right hip: Secondary | ICD-10-CM | POA: Diagnosis not present

## 2022-05-22 DIAGNOSIS — M25551 Pain in right hip: Secondary | ICD-10-CM | POA: Diagnosis not present

## 2022-05-22 DIAGNOSIS — E876 Hypokalemia: Secondary | ICD-10-CM | POA: Diagnosis not present

## 2022-05-22 DIAGNOSIS — Z96641 Presence of right artificial hip joint: Secondary | ICD-10-CM | POA: Diagnosis not present

## 2022-05-22 DIAGNOSIS — G8918 Other acute postprocedural pain: Secondary | ICD-10-CM | POA: Diagnosis not present

## 2022-05-22 DIAGNOSIS — R279 Unspecified lack of coordination: Secondary | ICD-10-CM | POA: Diagnosis not present

## 2022-05-22 DIAGNOSIS — Z4801 Encounter for change or removal of surgical wound dressing: Secondary | ICD-10-CM | POA: Diagnosis not present

## 2022-05-22 DIAGNOSIS — M9701XA Periprosthetic fracture around internal prosthetic right hip joint, initial encounter: Secondary | ICD-10-CM | POA: Diagnosis not present

## 2022-05-22 DIAGNOSIS — F32A Depression, unspecified: Secondary | ICD-10-CM | POA: Diagnosis not present

## 2022-05-22 DIAGNOSIS — Z471 Aftercare following joint replacement surgery: Secondary | ICD-10-CM | POA: Diagnosis not present

## 2022-05-22 DIAGNOSIS — F331 Major depressive disorder, recurrent, moderate: Secondary | ICD-10-CM | POA: Diagnosis not present

## 2022-05-22 DIAGNOSIS — I2510 Atherosclerotic heart disease of native coronary artery without angina pectoris: Secondary | ICD-10-CM | POA: Diagnosis not present

## 2022-05-23 DIAGNOSIS — S728X1A Other fracture of right femur, initial encounter for closed fracture: Secondary | ICD-10-CM | POA: Diagnosis not present

## 2022-05-23 DIAGNOSIS — I10 Essential (primary) hypertension: Secondary | ICD-10-CM | POA: Diagnosis not present

## 2022-05-23 DIAGNOSIS — E876 Hypokalemia: Secondary | ICD-10-CM | POA: Diagnosis not present

## 2022-05-24 DIAGNOSIS — E876 Hypokalemia: Secondary | ICD-10-CM | POA: Diagnosis not present

## 2022-05-24 DIAGNOSIS — M9701XA Periprosthetic fracture around internal prosthetic right hip joint, initial encounter: Secondary | ICD-10-CM | POA: Diagnosis not present

## 2022-05-25 DIAGNOSIS — E876 Hypokalemia: Secondary | ICD-10-CM | POA: Diagnosis not present

## 2022-05-26 DIAGNOSIS — M6281 Muscle weakness (generalized): Secondary | ICD-10-CM | POA: Diagnosis not present

## 2022-05-26 DIAGNOSIS — E876 Hypokalemia: Secondary | ICD-10-CM | POA: Diagnosis not present

## 2022-05-26 DIAGNOSIS — F331 Major depressive disorder, recurrent, moderate: Secondary | ICD-10-CM | POA: Diagnosis not present

## 2022-05-26 DIAGNOSIS — K5900 Constipation, unspecified: Secondary | ICD-10-CM | POA: Diagnosis not present

## 2022-05-26 DIAGNOSIS — M1611 Unilateral primary osteoarthritis, right hip: Secondary | ICD-10-CM | POA: Diagnosis not present

## 2022-05-26 DIAGNOSIS — M9701XD Periprosthetic fracture around internal prosthetic right hip joint, subsequent encounter: Secondary | ICD-10-CM | POA: Diagnosis not present

## 2022-05-26 DIAGNOSIS — F32A Depression, unspecified: Secondary | ICD-10-CM | POA: Diagnosis not present

## 2022-05-26 DIAGNOSIS — Z741 Need for assistance with personal care: Secondary | ICD-10-CM | POA: Diagnosis not present

## 2022-05-26 DIAGNOSIS — R5381 Other malaise: Secondary | ICD-10-CM | POA: Diagnosis not present

## 2022-05-26 DIAGNOSIS — I10 Essential (primary) hypertension: Secondary | ICD-10-CM | POA: Diagnosis not present

## 2022-05-26 DIAGNOSIS — M9701XA Periprosthetic fracture around internal prosthetic right hip joint, initial encounter: Secondary | ICD-10-CM | POA: Diagnosis not present

## 2022-05-26 DIAGNOSIS — Z471 Aftercare following joint replacement surgery: Secondary | ICD-10-CM | POA: Diagnosis not present

## 2022-05-26 DIAGNOSIS — L602 Onychogryphosis: Secondary | ICD-10-CM | POA: Diagnosis not present

## 2022-05-26 DIAGNOSIS — F4322 Adjustment disorder with anxiety: Secondary | ICD-10-CM | POA: Diagnosis not present

## 2022-05-26 DIAGNOSIS — Z9889 Other specified postprocedural states: Secondary | ICD-10-CM | POA: Diagnosis not present

## 2022-05-26 DIAGNOSIS — Z789 Other specified health status: Secondary | ICD-10-CM | POA: Diagnosis not present

## 2022-05-26 DIAGNOSIS — R279 Unspecified lack of coordination: Secondary | ICD-10-CM | POA: Diagnosis not present

## 2022-05-26 DIAGNOSIS — I2510 Atherosclerotic heart disease of native coronary artery without angina pectoris: Secondary | ICD-10-CM | POA: Diagnosis not present

## 2022-05-26 DIAGNOSIS — Z96641 Presence of right artificial hip joint: Secondary | ICD-10-CM | POA: Diagnosis not present

## 2022-05-29 DIAGNOSIS — F32A Depression, unspecified: Secondary | ICD-10-CM | POA: Diagnosis not present

## 2022-05-29 DIAGNOSIS — R5381 Other malaise: Secondary | ICD-10-CM | POA: Diagnosis not present

## 2022-05-29 DIAGNOSIS — K5900 Constipation, unspecified: Secondary | ICD-10-CM | POA: Diagnosis not present

## 2022-05-29 DIAGNOSIS — M1611 Unilateral primary osteoarthritis, right hip: Secondary | ICD-10-CM | POA: Diagnosis not present

## 2022-05-29 DIAGNOSIS — M9701XA Periprosthetic fracture around internal prosthetic right hip joint, initial encounter: Secondary | ICD-10-CM | POA: Diagnosis not present

## 2022-05-31 DIAGNOSIS — R5381 Other malaise: Secondary | ICD-10-CM | POA: Diagnosis not present

## 2022-05-31 DIAGNOSIS — M1611 Unilateral primary osteoarthritis, right hip: Secondary | ICD-10-CM | POA: Diagnosis not present

## 2022-05-31 DIAGNOSIS — F331 Major depressive disorder, recurrent, moderate: Secondary | ICD-10-CM | POA: Diagnosis not present

## 2022-05-31 DIAGNOSIS — Z9889 Other specified postprocedural states: Secondary | ICD-10-CM | POA: Diagnosis not present

## 2022-05-31 DIAGNOSIS — I10 Essential (primary) hypertension: Secondary | ICD-10-CM | POA: Diagnosis not present

## 2022-05-31 DIAGNOSIS — M9701XD Periprosthetic fracture around internal prosthetic right hip joint, subsequent encounter: Secondary | ICD-10-CM | POA: Diagnosis not present

## 2022-06-01 DIAGNOSIS — F331 Major depressive disorder, recurrent, moderate: Secondary | ICD-10-CM | POA: Diagnosis not present

## 2022-06-01 DIAGNOSIS — F4322 Adjustment disorder with anxiety: Secondary | ICD-10-CM | POA: Diagnosis not present

## 2022-06-02 DIAGNOSIS — M1611 Unilateral primary osteoarthritis, right hip: Secondary | ICD-10-CM | POA: Diagnosis not present

## 2022-06-05 DIAGNOSIS — M1611 Unilateral primary osteoarthritis, right hip: Secondary | ICD-10-CM | POA: Diagnosis not present

## 2022-06-08 DIAGNOSIS — M1611 Unilateral primary osteoarthritis, right hip: Secondary | ICD-10-CM | POA: Diagnosis not present

## 2022-06-13 DIAGNOSIS — M1611 Unilateral primary osteoarthritis, right hip: Secondary | ICD-10-CM | POA: Diagnosis not present

## 2022-06-13 DIAGNOSIS — F331 Major depressive disorder, recurrent, moderate: Secondary | ICD-10-CM | POA: Diagnosis not present

## 2022-06-13 DIAGNOSIS — L602 Onychogryphosis: Secondary | ICD-10-CM | POA: Diagnosis not present

## 2022-06-13 DIAGNOSIS — F4322 Adjustment disorder with anxiety: Secondary | ICD-10-CM | POA: Diagnosis not present

## 2022-06-14 DIAGNOSIS — Z96641 Presence of right artificial hip joint: Secondary | ICD-10-CM | POA: Diagnosis not present

## 2022-06-14 DIAGNOSIS — M1611 Unilateral primary osteoarthritis, right hip: Secondary | ICD-10-CM | POA: Diagnosis not present

## 2022-06-14 DIAGNOSIS — Z471 Aftercare following joint replacement surgery: Secondary | ICD-10-CM | POA: Diagnosis not present

## 2022-06-19 DIAGNOSIS — M9701XD Periprosthetic fracture around internal prosthetic right hip joint, subsequent encounter: Secondary | ICD-10-CM | POA: Diagnosis not present

## 2022-06-19 DIAGNOSIS — R5381 Other malaise: Secondary | ICD-10-CM | POA: Diagnosis not present

## 2022-06-19 DIAGNOSIS — I10 Essential (primary) hypertension: Secondary | ICD-10-CM | POA: Diagnosis not present

## 2022-06-19 DIAGNOSIS — F32A Depression, unspecified: Secondary | ICD-10-CM | POA: Diagnosis not present

## 2022-06-23 DIAGNOSIS — S72001D Fracture of unspecified part of neck of right femur, subsequent encounter for closed fracture with routine healing: Secondary | ICD-10-CM | POA: Diagnosis not present

## 2022-06-23 DIAGNOSIS — I10 Essential (primary) hypertension: Secondary | ICD-10-CM | POA: Diagnosis not present

## 2022-06-23 DIAGNOSIS — Z96641 Presence of right artificial hip joint: Secondary | ICD-10-CM | POA: Diagnosis not present

## 2022-06-28 DIAGNOSIS — Z789 Other specified health status: Secondary | ICD-10-CM | POA: Diagnosis not present

## 2022-06-28 DIAGNOSIS — F32A Depression, unspecified: Secondary | ICD-10-CM | POA: Diagnosis not present

## 2022-06-28 DIAGNOSIS — I10 Essential (primary) hypertension: Secondary | ICD-10-CM | POA: Diagnosis not present

## 2022-07-05 DIAGNOSIS — Z471 Aftercare following joint replacement surgery: Secondary | ICD-10-CM | POA: Diagnosis not present

## 2022-07-05 DIAGNOSIS — Z96641 Presence of right artificial hip joint: Secondary | ICD-10-CM | POA: Diagnosis not present

## 2022-07-24 DIAGNOSIS — F32A Depression, unspecified: Secondary | ICD-10-CM | POA: Diagnosis not present

## 2022-07-24 DIAGNOSIS — I10 Essential (primary) hypertension: Secondary | ICD-10-CM | POA: Diagnosis not present

## 2022-07-26 DIAGNOSIS — Z471 Aftercare following joint replacement surgery: Secondary | ICD-10-CM | POA: Diagnosis not present

## 2022-07-26 DIAGNOSIS — I10 Essential (primary) hypertension: Secondary | ICD-10-CM | POA: Diagnosis not present

## 2022-07-26 DIAGNOSIS — G8929 Other chronic pain: Secondary | ICD-10-CM | POA: Diagnosis not present

## 2022-07-26 DIAGNOSIS — Z96641 Presence of right artificial hip joint: Secondary | ICD-10-CM | POA: Diagnosis not present

## 2022-07-26 DIAGNOSIS — M25551 Pain in right hip: Secondary | ICD-10-CM | POA: Diagnosis not present

## 2022-11-21 ENCOUNTER — Ambulatory Visit (INDEPENDENT_AMBULATORY_CARE_PROVIDER_SITE_OTHER): Payer: Medicare Other | Admitting: Sports Medicine

## 2022-11-21 ENCOUNTER — Ambulatory Visit (INDEPENDENT_AMBULATORY_CARE_PROVIDER_SITE_OTHER): Payer: Medicare Other

## 2022-11-21 DIAGNOSIS — M160 Bilateral primary osteoarthritis of hip: Secondary | ICD-10-CM | POA: Diagnosis not present

## 2022-11-21 MED ORDER — TRAMADOL HCL 50 MG PO TABS
50.0000 mg | ORAL_TABLET | Freq: Three times a day (TID) | ORAL | 0 refills | Status: AC | PRN
Start: 2022-11-21 — End: ?

## 2022-11-21 MED ORDER — CELECOXIB 200 MG PO CAPS
ORAL_CAPSULE | ORAL | 2 refills | Status: AC
Start: 2022-11-21 — End: ?

## 2022-11-21 NOTE — Assessment & Plan Note (Signed)
This is a pleasant 72 year old female, she has a history of a right total hip arthroplasty, subsequently periprosthetic fracture with revision arthroplasty and multiple cerclage wires. This was about 6 months ago, unfortunately continues to have pain that she localizes laterally over the greater trochanter, she had a trochanteric bursa injection by her orthopedic surgeon with temporary relief.   On exam she has tenderness to palpation in this region, she has profoundly weak hip abductor's, I would like some x-rays to ensure no recurrent periprosthetic fracture, we will use Celebrex twice a day and tramadol for breakthrough pain, she understands that she needs aggressive physical therapy for her hip abductors. Return to see me in 6 weeks.

## 2022-11-21 NOTE — Progress Notes (Signed)
    Procedures performed today:    None.  Independent interpretation of notes and tests performed by another provider:   None.  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 This was about 6 months ago, unfortunately continues to have pain that she localizes laterally over the greater trochanter, she had a trochanteric bursa injection by her orthopedic surgeon with temporary relief.   On exam she has tenderness to palpation in this region, she has profoundly weak hip abductor's, I would like some x-rays to ensure no recurrent periprosthetic fracture, we will use Celebrex twice a day and tramadol for breakthrough pain, she understands that she needs aggressive physical therapy for her hip abductors. Return to see me in 6 weeks.    ____________________________________________ Ihor Austin. Benjamin Stain, M.D., ABFM., CAQSM., AME. Primary Care and Sports Medicine [facility]  Adjunct Professor of Family Medicine  [place] [employer] [facility] [employer]  Restaurant manager, fast food

## 2022-11-23 ENCOUNTER — Ambulatory Visit: Payer: Medicare Other

## 2022-11-28 ENCOUNTER — Ambulatory Visit: Payer: Medicare Other | Admitting: Physical Therapy

## 2022-11-29 ENCOUNTER — Encounter: Payer: Self-pay | Admitting: Physical Therapy

## 2022-11-29 ENCOUNTER — Other Ambulatory Visit: Payer: Self-pay

## 2022-11-29 ENCOUNTER — Ambulatory Visit: Payer: Medicare Other | Attending: Sports Medicine | Admitting: Physical Therapy

## 2022-11-29 DIAGNOSIS — M25551 Pain in right hip: Secondary | ICD-10-CM | POA: Insufficient documentation

## 2022-11-29 DIAGNOSIS — M160 Bilateral primary osteoarthritis of hip: Secondary | ICD-10-CM | POA: Insufficient documentation

## 2022-11-29 DIAGNOSIS — R2681 Unsteadiness on feet: Secondary | ICD-10-CM | POA: Insufficient documentation

## 2022-11-29 DIAGNOSIS — R262 Difficulty in walking, not elsewhere classified: Secondary | ICD-10-CM | POA: Insufficient documentation

## 2022-11-29 DIAGNOSIS — M6281 Muscle weakness (generalized): Secondary | ICD-10-CM | POA: Diagnosis present

## 2022-11-29 NOTE — Therapy (Signed)
OUTPATIENT PHYSICAL THERAPY LOWER EXTREMITY EVALUATION   Patient Name: Kristin Romero MRN: [id number] DOB:April 21, 1951, 72 y.o., female Today's Date: 11/29/2022  END OF SESSION:  PT End of Session - 11/29/22 1156     Visit Number 1    Number of Visits 17    Date for PT Re-Evaluation 01/24/23    Authorization Type MCR/[employer]    Authorization Time Period 11/29/22 to 01/24/23    Progress Note Due on Visit 10    PT Start Time 1102    PT Stop Time 1145    PT Time Calculation (min) 43 min    Activity Tolerance Patient tolerated treatment well    Behavior During Therapy [facility] for tasks assessed/performed             Past Medical History:  Diagnosis Date   ASCUS favor benign 09/2014   negative high risk HPV recommend repeat Pap smear one year   Hypertension    History reviewed. No pertinent surgical history. 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 for periprosthetic fracture 12/28/2021   Trochanteric bursitis, right hip 12/06/2021   Dislocation of shoulder, right, closed 07/22/2015    PCP: Marden Noble MD   REFERRING PROVIDER: Monica Becton, MD  REFERRING DIAG:  M16.0 (ICD-10-CM) - Primary osteoarthritis of both hips    THERAPY DIAG:  Pain in right hip  Muscle weakness (generalized)  Difficulty in walking, not elsewhere classified  Unsteadiness on feet  Rationale for Evaluation and Treatment: Rehabilitation  ONSET DATE: about a year ago   SUBJECTIVE:   SUBJECTIVE STATEMENT: My hip issues started about a year ago, just had progressive pain over time. Went to a doctor in [place] (Dr. Karie Schwalbe), who did xrays and saw a lot of OA, told me I would eventually need surgery. I was visiting my daughter in [place] and family noticed that steps have gotten difficult for me, then I saw an orthopedic in [place] and he told me I was bone on bone and I ended up  having a hip replacement in [place] about a week later. Then things went bad, I was walking around with my walker after surgery, didn't fall but had a mis-step and was found to have multiple femur fractures on that side. I went back to the hospital and ended up with total hip revision due to the fractures. When I came back from [place] a month ago it felt fine but now I'm having nerve pains up and down my leg.   PERTINENT HISTORY: HTN PAIN:  Are you having pain? No 0/10 "just sore", sometimes have pains starting  from my hip going down my leg (shooting pain). At worst pain can get to 7-8/10  PRECAUTIONS: Fall  WEIGHT BEARING RESTRICTIONS: No  FALLS:  Has patient fallen in last 6 months? Yes. Number of falls 2- one fall at home in bathroom tripping on a rug, second fall at daughter's house in [facility] lost balance going around a corner. (+) FOF.   LIVING ENVIRONMENT: Lives with: lives with their family Lives in: House/apartment Stairs: flight of steps (lives downstairs), 2STE to enter home  Has following equipment at home: Single point cane  OCCUPATION: retired   PLOF: Independent, Independent with basic ADLs, Independent with gait, and Independent with transfers  PATIENT GOALS: be able to walk correctly without a limp, no pain   NEXT MD VISIT: Dr. Karie Schwalbe 01/02/23  OBJECTIVE:  DIAGNOSTIC FINDINGS: EXAM: DG HIP (WITH OR WITHOUT PELVIS) 2-3V RIGHT   COMPARISON:  12/28/2021.   FINDINGS: Pelvis is intact with normal and symmetric sacroiliac joints.   No acute fracture or dislocation. Old healed fracture of right superior and inferior pubic rami noted.   No aggressive osseous lesion.   Visualized sacral arcuate lines are unremarkable.   There are changes of chronic pubic symphisitis.   Right hip arthroplasty noted with multiple cerclage wires. There is near anatomic alignment. No periprosthetic fracture or lucency.   There are moderate to severe degenerative changes of the left  hip joint in the form of reduced joint space, subchondral sclerosis / cystic changes and osteophytosis.   No radiopaque foreign bodies.   IMPRESSION: No acute osseous abnormalities.  PATIENT SURVEYS:  FOTO 60  COGNITION: Overall cognitive status: Within functional limits for tasks assessed         LOWER EXTREMITY MMT:  MMT Right eval Left eval  Hip flexion 3 4-  Hip extension 3- 3+  Hip abduction 3- 3  Hip adduction    Hip internal rotation    Hip external rotation    Knee flexion 4 4  Knee extension 4+ 4+  Ankle dorsiflexion 5 5  Ankle plantarflexion    Ankle inversion    Ankle eversion    Shoulder flexion  4- 4-  Shoulder ABD  4- 4-  Biceps  4+ 4+  Triceps  4+ 4+   (Blank rows = not tested)    FUNCTIONAL TESTS:  Berg Balance Scale: 46/56 5x sit to stand: 15.4 seconds no UEs   GAIT: Distance walked: in clinic distances  Assistive device utilized: None Level of assistance: Complete Independence Comments: narrow BOS, limited stance time R LE, + trendelenburg, mildly unsteady   TODAY'S TREATMENT:                                                                                                                              DATE:   Eval  Objective measures, care planning, appropriate education  TherEx  Bridges + ABD into green TB x5 Sidelying clams green TB x5 Discussed and demonstrated tandem stance in corner     PATIENT EDUCATION:  Education details: exam findings, POC, HEP  Person educated: Patient Education method: Programmer, multimedia, Demonstration, and Handouts Education comprehension: verbalized understanding, returned demonstration, and needs further education  HOME EXERCISE PROGRAM:   Access Code: [email or web address] URL: [email or web address] Date: 11/29/2022 Prepared by: Nedra Hai  Exercises - Supine Bridge with Resistance Band  - 1-2 x daily - 7 x weekly - 1 sets - 10 reps - 2 hold - Clam with Resistance  - 1-2 x daily - 7 x  weekly - 1 sets - 10 reps - 2 hold - Tandem Stance in Corner  - 1-2 x daily - 7 x weekly - 1 sets - 6 reps - 15-30 hold  ASSESSMENT:  CLINICAL IMPRESSION: Patient is a 72 y.o. F who  was seen today for physical therapy evaluation and treatment for hip OA. Of note, she did have significant surgical history last year (initial THA, followed by revision due to periprosthetic fracture) as well as multiple recent falls. Exam is as expected and reveals significant functional muscle weakness, gait and balance impairment, and impaired functional activity tolerance. Will benefit from skilled PT services to address all impairments and assist in return to optimal level of function.   OBJECTIVE IMPAIRMENTS: Abnormal gait, decreased activity tolerance, decreased balance, decreased mobility, difficulty walking, decreased strength, and pain.   ACTIVITY LIMITATIONS: standing, squatting, stairs, transfers, and locomotion level  PARTICIPATION LIMITATIONS: cleaning, laundry, shopping, community activity, yard work, and church  PERSONAL FACTORS: Age, Behavior pattern, Fitness, Past/current experiences, and Time since onset of injury/illness/exacerbation are also affecting patient's functional outcome.   REHAB POTENTIAL: Good  CLINICAL DECISION MAKING: Stable/uncomplicated  EVALUATION COMPLEXITY: Low   GOALS: Goals reviewed with patient? No  SHORT TERM GOALS: Target date: 12/27/2022   Will be compliant with appropriate progressive HEP  Baseline: Goal status: INITIAL  2.  Will complete 5X sit to stand test in 10 seconds to show improved strength and transfer ability  Baseline:  Goal status: INITIAL  3.  Gait mechanics to have normalized including equal step lengths/stance times, improved BOS, resolution of trendelenburg  Baseline:  Goal status: INITIAL  4.  Pain in R hip to be no more than 4/10 at worst  Baseline:  Goal status: INITIAL    LONG TERM GOALS: Target date: 01/24/2023    MMT to have  improved by at least 1 grade in all weak groups  Baseline:  Goal status: INITIAL  2.  Will score at least 54/56 on Berg balance test to show reduced overall fall risk  Baseline:  Goal status: INITIAL  3.  Will be able to perform floor to stand transfers with good technique and balance to assist in return to PLOF  Baseline:  Goal status: INITIAL  4.  Will be able to get up from low surfaces and unsteady surfaces (IE, a low couch) without difficulty in order to show improved mobility and strength  Baseline:  Goal status: INITIAL  5.  Will be compliant with appropriate gym based exercise program vs advanced HEP to maintain functional gains and prevent recurrence of pain  Baseline:  Goal status: INITIAL     PLAN:  PT FREQUENCY: 2x/week  PT DURATION: 8 weeks  PLANNED INTERVENTIONS: Therapeutic exercises, Therapeutic activity, Neuromuscular re-education, Balance training, Gait training, Patient/Family education, Self Care, Stair training, Aquatic Therapy, Dry Needling, Cryotherapy, Moist heat, Taping, Ultrasound, Ionotophoresis 4mg /ml Dexamethasone, Manual therapy, and Re-evaluation  PLAN FOR NEXT SESSION: strength and balance, general conditioning. Will need some additional HEP updates before her trip to [place], PT, DPT 11/29/22 11:57 AM

## 2022-11-30 ENCOUNTER — Ambulatory Visit: Payer: Medicare Other | Admitting: Physical Therapy

## 2022-12-01 ENCOUNTER — Encounter: Payer: Medicare Other | Admitting: Rehabilitative and Restorative Service Providers"

## 2022-12-01 ENCOUNTER — Telehealth: Payer: Self-pay | Admitting: Rehabilitative and Restorative Service Providers"

## 2022-12-01 NOTE — Telephone Encounter (Signed)
Called patient secondary to missed visit.  Left a message to notify of next scheduled appointment and to please call back office if she needs to reschedule/cancel any appointments.

## 2022-12-06 ENCOUNTER — Ambulatory Visit: Payer: Medicare Other | Admitting: Physical Therapy

## 2022-12-20 ENCOUNTER — Ambulatory Visit: Payer: Medicare Other | Admitting: Rehabilitative and Restorative Service Providers"

## 2022-12-25 ENCOUNTER — Ambulatory Visit: Payer: Medicare Other | Admitting: Rehabilitative and Restorative Service Providers"

## 2022-12-27 ENCOUNTER — Ambulatory Visit: Payer: Medicare Other | Attending: Sports Medicine | Admitting: Rehabilitative and Restorative Service Providers"

## 2022-12-27 DIAGNOSIS — R262 Difficulty in walking, not elsewhere classified: Secondary | ICD-10-CM | POA: Insufficient documentation

## 2022-12-27 DIAGNOSIS — R2681 Unsteadiness on feet: Secondary | ICD-10-CM | POA: Insufficient documentation

## 2022-12-27 DIAGNOSIS — M25551 Pain in right hip: Secondary | ICD-10-CM | POA: Insufficient documentation

## 2022-12-27 DIAGNOSIS — M6281 Muscle weakness (generalized): Secondary | ICD-10-CM | POA: Insufficient documentation

## 2023-01-01 ENCOUNTER — Ambulatory Visit: Payer: Medicare Other | Admitting: Rehabilitative and Restorative Service Providers"

## 2023-01-01 ENCOUNTER — Encounter: Payer: Self-pay | Admitting: Rehabilitative and Restorative Service Providers"

## 2023-01-01 DIAGNOSIS — M6281 Muscle weakness (generalized): Secondary | ICD-10-CM | POA: Diagnosis present

## 2023-01-01 DIAGNOSIS — R262 Difficulty in walking, not elsewhere classified: Secondary | ICD-10-CM | POA: Diagnosis present

## 2023-01-01 DIAGNOSIS — M25551 Pain in right hip: Secondary | ICD-10-CM

## 2023-01-01 DIAGNOSIS — R2681 Unsteadiness on feet: Secondary | ICD-10-CM

## 2023-01-01 NOTE — Therapy (Signed)
OUTPATIENT PHYSICAL THERAPY LOWER EXTREMITY EVALUATION   Patient Name: Kristin Romero MRN: [id number] DOB:01/02/1951, 72 y.o., female Today's Date: 01/01/2023  END OF SESSION:  PT End of Session - 01/01/23 1238     Visit Number 2    Date for PT Re-Evaluation 01/24/23    Authorization Type [employer]    Progress Note Due on Visit 10    PT Start Time 1236    PT Stop Time 1314    PT Time Calculation (min) 38 min    Activity Tolerance Patient tolerated treatment well    Behavior During Therapy [facility] for tasks assessed/performed             Past Medical History:  Diagnosis Date   ASCUS favor benign 09/2014   negative high risk HPV recommend repeat Pap smear one year   Hypertension    History reviewed. No pertinent surgical history. 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  State that she did have some increased pain with sitting on the plane and [place] the airport.  Pt reports that she was able to walk in the sand without losing her balance.  PERTINENT HISTORY: HTN  PAIN:  PAIN:  Are you having pain? Yes NPRS scale: 5/10 Pain location: right hip to upper leg PAIN TYPE: aching and sharp Pain description: intermittent  Aggravating factors: walking,  prolonged sitting Relieving factors: lying supine   PRECAUTIONS: Fall  WEIGHT BEARING RESTRICTIONS: No  FALLS:  Has patient fallen in last 6 months? Yes. Number of falls 2- one fall at home in bathroom tripping on a rug, second fall at daughter's house in [facility] lost balance going around a corner. (+) FOF.   LIVING ENVIRONMENT: Lives with: lives with their family Lives in: House/apartment Stairs: flight of steps (lives downstairs), 2STE to enter home  Has following equipment at home: Single point cane  OCCUPATION: retired   PLOF: Independent, Independent with basic ADLs, Independent with gait, and Independent with transfers  PATIENT GOALS: be able to walk correctly without a limp, no pain   NEXT MD VISIT: Dr. Karie Schwalbe 01/02/23  OBJECTIVE:   DIAGNOSTIC FINDINGS: EXAM: DG HIP (WITH OR WITHOUT PELVIS) 2-3V RIGHT   COMPARISON:  12/28/2021.   FINDINGS: Pelvis is intact with normal and symmetric sacroiliac joints.   No acute fracture or dislocation. Old healed fracture of right superior and inferior pubic rami noted.   No aggressive osseous lesion.   Visualized sacral arcuate lines are unremarkable.   There are changes of chronic pubic symphisitis.   Right hip arthroplasty noted with multiple cerclage wires. There is near anatomic alignment. No periprosthetic fracture or lucency.   There are moderate to severe degenerative changes of the left hip joint in the form of reduced joint space, subchondral sclerosis / cystic changes and osteophytosis.  No radiopaque foreign bodies.   IMPRESSION: No acute osseous abnormalities.  PATIENT SURVEYS:  Eval:  FOTO 60   COGNITION: Overall cognitive status: Within functional limits for tasks assessed       LOWER EXTREMITY MMT:  MMT Right eval Left eval  Hip flexion 3 4-  Hip extension 3- 3+  Hip abduction 3- 3  Hip adduction    Hip internal rotation    Hip external rotation    Knee flexion 4 4  Knee extension 4+ 4+  Ankle  dorsiflexion 5 5  Ankle plantarflexion    Ankle inversion    Ankle eversion    Shoulder flexion  4- 4-  Shoulder ABD  4- 4-  Biceps  4+ 4+  Triceps  4+ 4+   (Blank rows = not tested)    FUNCTIONAL TESTS:  Eval: Berg Balance Scale: 46/56 5x sit to stand: 15.4 seconds no UEs   01/01/2023 5 times sit to stand:  12.99 sec 3 min walk test:  453 ft with pain increasing to 7/10  GAIT: Distance walked: in clinic distances  Assistive device utilized: None Level of assistance: Complete Independence Comments: narrow BOS, limited stance time R LE, + trendelenburg, mildly unsteady   TODAY'S TREATMENT:                                                                                                                               DATE: 01/01/2023 Nustep level 4 x6 min with PT present to discuss status Seated hamstring stretch 2x20 sec Sit to/from stand from mat holding 5# kettlebell 2x10 Standing hip abduction, hip extension, and marching.  x10 each bilat Seated LAQ with ball squeeze and 2# 2x10 bilat Seated hip ER with 2# 2x10 bilat Supine bridge 2x10 Side-lying clamshell with green tband 2x10 bilat 3 min ambulation for 453 ft with pain increasing to 7/10    PATIENT EDUCATION:  Education details: exam findings, POC, HEP  Person educated: Patient Education method: Explanation, Demonstration, and Handouts Education comprehension: verbalized understanding, returned demonstration, and needs further education  HOME EXERCISE PROGRAM:   Access Code: [email or web address] URL: [email or web address] Date: 01/01/2023 Prepared by: Clydie Braun Rinaldo Macqueen  Exercises - Clam with Resistance  - 1-2 x daily - 7 x weekly - 1 sets - 10 reps - 2 hold - Supine Bridge  - 1 x daily - 7 x weekly - 2 sets - 10 reps - Seated Hip External Rotation AROM  - 1 x daily - 7 x weekly - 2 sets - 10 reps - Tandem Stance in Corner  - 1-2 x daily - 7 x weekly - 1 sets - 6 reps - 15-30 hold - Standing Hip Extension  with Counter Support  - 1 x daily - 7 x weekly - 1-2 sets - 10 reps - Standing Hip Abduction with Counter Support  - 1 x daily - 7 x weekly - 1-2 sets - 10 reps - Standing March with Counter Support  -  1 x daily - 7 x weekly - 1-2 sets - 10 reps  ASSESSMENT:  CLINICAL IMPRESSION: Arline Asp presents to skilled PT reporting that she did her exercises while on the break secondary to traveling out of town.  Patient requires cuing with supine bridge secondary to weakness in RLE.  Patient provided with updated HEP handout sheets.  Patient able to participate in 3 min walk test, but had to stop at the end of 3 minutes and take a seated recovery period with pain reported of 7/10.  Pt reports that pain subsides upon returning to sit.  Pt continues to require skilled PT to progress towards goal related activities.  OBJECTIVE IMPAIRMENTS: Abnormal gait, decreased activity tolerance, decreased balance, decreased mobility, difficulty walking, decreased strength, and pain.   ACTIVITY LIMITATIONS: standing, squatting, stairs, transfers, and locomotion level  PARTICIPATION LIMITATIONS: cleaning, laundry, shopping, community activity, yard work, and church  PERSONAL FACTORS: Age, Behavior pattern, Fitness, Past/current experiences, and Time since onset of injury/illness/exacerbation are also affecting patient's functional outcome.   REHAB POTENTIAL: Good  CLINICAL DECISION MAKING: Stable/uncomplicated  EVALUATION COMPLEXITY: Low   GOALS: Goals reviewed with patient? No  SHORT TERM GOALS: Target date: 12/27/2022   Will be compliant with appropriate progressive HEP  Baseline: Goal status: MET  2.  Will complete 5X sit to stand test in 10 seconds to show improved strength and transfer ability  Baseline:  Goal status: IN PROGRESS  3.  Gait mechanics to have normalized including equal step lengths/stance times, improved BOS, resolution of trendelenburg  Baseline:  Goal status: IN PROGRESS  4.  Pain in  R hip to be no more than 4/10 at worst  Baseline:  Goal status: IN PROGRESS    LONG TERM GOALS: Target date: 01/24/2023    MMT to have improved by at least 1 grade in all weak groups  Baseline:  Goal status: INITIAL  2.  Will score at least 54/56 on Berg balance test to show reduced overall fall risk  Baseline:  Goal status: INITIAL  3.  Will be able to perform floor to stand transfers with good technique and balance to assist in return to PLOF  Baseline:  Goal status: INITIAL  4.  Will be able to get up from low surfaces and unsteady surfaces (IE, a low couch) without difficulty in order to show improved mobility and strength  Baseline:  Goal status: INITIAL  5.  Will be compliant with appropriate gym based exercise program vs advanced HEP to maintain functional gains and prevent recurrence of pain  Baseline:  Goal status: INITIAL     PLAN:  PT FREQUENCY: 2x/week  PT DURATION: 8 weeks  PLANNED INTERVENTIONS: Therapeutic exercises, Therapeutic activity, Neuromuscular re-education, Balance training, Gait training, Patient/Family education, Self Care, Stair training, Aquatic Therapy, Dry Needling, Cryotherapy, Moist heat, Taping, Ultrasound, Ionotophoresis 4mg /ml Dexamethasone, Manual therapy, and Re-evaluation  PLAN FOR NEXT SESSION: strength and balance, general conditioning.    Reather Laurence, PT, DPT 01/01/23, 1:26 PM  [facility] [address], [address] [place], [place] [address] Phone # [phone] Fax [phone]

## 2023-01-02 ENCOUNTER — Ambulatory Visit: Payer: Medicare Other | Admitting: Sports Medicine

## 2023-01-03 ENCOUNTER — Encounter: Payer: Self-pay | Admitting: Rehabilitative and Restorative Service Providers"

## 2023-01-03 ENCOUNTER — Ambulatory Visit: Payer: Medicare Other | Admitting: Rehabilitative and Restorative Service Providers"

## 2023-01-03 DIAGNOSIS — M25551 Pain in right hip: Secondary | ICD-10-CM | POA: Diagnosis not present

## 2023-01-03 DIAGNOSIS — R2681 Unsteadiness on feet: Secondary | ICD-10-CM

## 2023-01-03 DIAGNOSIS — M6281 Muscle weakness (generalized): Secondary | ICD-10-CM

## 2023-01-03 DIAGNOSIS — R262 Difficulty in walking, not elsewhere classified: Secondary | ICD-10-CM

## 2023-01-03 NOTE — Therapy (Signed)
OUTPATIENT PHYSICAL THERAPY TREATMENT NOTE   Patient Name: Kristin Romero MRN: [id number] DOB:05-09-1951, 72 y.o., female Today's Date: 01/03/2023  END OF SESSION:  PT End of Session - 01/03/23 1239     Visit Number 3    Date for PT Re-Evaluation 01/24/23    Authorization Type M[employer]    Progress Note Due on Visit 10    PT Start Time 1237    PT Stop Time 1315    PT Time Calculation (min) 38 min    Activity Tolerance Patient tolerated treatment well    Behavior During Therapy [facility] for tasks assessed/performed             Past Medical History:  Diagnosis Date   ASCUS favor benign 09/2014   negative high risk HPV recommend repeat Pap smear one year   Hypertension    History reviewed. No pertinent surgical history. 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    PERTINENT HISTORY: HTN  PAIN:  PAIN:  Are you having pain? Yes NPRS scale: 5/10 Pain location: right hip to upper leg PAIN TYPE: aching and sharp Pain description: intermittent  Aggravating factors: walking, prolonged sitting Relieving factors: lying supine   PRECAUTIONS: Fall  WEIGHT BEARING RESTRICTIONS: No  FALLS:  Has patient fallen in last 6 months? Yes. Number of falls 2- one fall at  home in bathroom tripping on a rug, second fall at daughter's house in [facility] lost balance going around a corner. (+) FOF.   LIVING ENVIRONMENT: Lives with: lives with their family Lives in: House/apartment Stairs: flight of steps (lives downstairs), 2STE to enter home  Has following equipment at home: Single point cane  OCCUPATION: retired   PLOF: Independent, Independent with basic ADLs, Independent with gait, and Independent with transfers  PATIENT GOALS: be able to walk correctly without a limp, no pain   NEXT MD VISIT: Dr. Karie Schwalbe 01/02/23  OBJECTIVE:   DIAGNOSTIC FINDINGS: EXAM: DG HIP (WITH OR WITHOUT PELVIS) 2-3V RIGHT   COMPARISON:  12/28/2021.   FINDINGS: Pelvis is intact with normal and symmetric sacroiliac joints.   No acute fracture or dislocation. Old healed fracture of right superior and inferior pubic rami noted.   No aggressive osseous lesion.   Visualized sacral arcuate lines are unremarkable.   There are changes of chronic pubic symphisitis.   Right hip arthroplasty noted with multiple cerclage wires. There is near anatomic alignment. No periprosthetic fracture or lucency.   There are moderate to severe degenerative changes of the left hip joint in the form of reduced joint space, subchondral sclerosis / cystic changes and osteophytosis.   No radiopaque foreign bodies.   IMPRESSION: No acute osseous abnormalities.  PATIENT SURVEYS:  Eval:  FOTO 60   COGNITION: Overall cognitive status: Within functional limits for tasks assessed  LOWER EXTREMITY MMT:  MMT Right eval Left eval  Hip flexion 3 4-  Hip extension 3- 3+  Hip abduction 3- 3  Hip adduction    Hip internal rotation    Hip external rotation    Knee flexion 4 4  Knee extension 4+ 4+  Ankle dorsiflexion 5 5  Ankle plantarflexion    Ankle inversion    Ankle eversion    Shoulder flexion  4- 4-  Shoulder ABD  4- 4-  Biceps  4+ 4+  Triceps  4+ 4+   (Blank rows = not  tested)    FUNCTIONAL TESTS:  Eval: Berg Balance Scale: 46/56 5x sit to stand: 15.4 seconds no UEs   01/01/2023 5 times sit to stand:  12.99 sec 3 min walk test:  453 ft with pain increasing to 7/10  GAIT: Distance walked: in clinic distances  Assistive device utilized: None Level of assistance: Complete Independence Comments: narrow BOS, limited stance time R LE, + trendelenburg, mildly unsteady   TODAY'S TREATMENT:                                                                                                                               DATE: 01/03/2023 Nustep level 4 x6 min with PT present to discuss status Seated hamstring stretch 2x20 sec Sit to/from stand from mat holding 5# kettlebell 2x10 Seated LAQ with ball squeeze and 2# 2x10 bilat Seated hip ER with 2# 2x10 bilat Standing with 2# on ankles: hip abduction, hip extension, and marching.  x10 each bilat Standing hip hiking from 2" step x10 bilat Seated piriformis stretch 2x20 sec bilat Seated 3 way blue pball rollout 3x5 sec hold each   DATE: 01/01/2023 Nustep level 4 x6 min with PT present to discuss status Seated hamstring stretch 2x20 sec Sit to/from stand from mat holding 5# kettlebell 2x10 Standing hip abduction, hip extension, and marching.  x10 each bilat Seated LAQ with ball squeeze and 2# 2x10 bilat Seated hip ER with 2# 2x10 bilat Supine bridge 2x10 Side-lying clamshell with green tband 2x10 bilat 3 min ambulation for 453 ft with pain increasing to 7/10    PATIENT EDUCATION:  Education details: exam findings, POC, HEP  Person educated: Patient Education method: Explanation, Demonstration, and Handouts Education comprehension: verbalized understanding, returned demonstration, and needs further education  HOME EXERCISE PROGRAM:   Access Code: [email or web address] URL: [email or web address] Date: 01/01/2023 Prepared by: Clydie Braun Pradeep Beaubrun  Exercises - Clam with Resistance  - 1-2 x daily - 7 x  weekly - 1 sets - 10 reps - 2 hold - Supine Bridge  - 1 x daily - 7 x weekly - 2 sets - 10 reps - Seated Hip External Rotation AROM  - 1 x daily - 7 x weekly - 2 sets - 10 reps - Tandem Stance in Corner  - 1-2 x daily - 7 x weekly - 1 sets - 6 reps - 15-30 hold - Standing Hip  Extension with Counter Support  - 1 x daily - 7 x weekly - 1-2 sets - 10 reps - Standing Hip Abduction with Counter Support  - 1 x daily - 7 x weekly - 1-2 sets - 10 reps - Standing March with Counter Support  - 1 x daily - 7 x weekly - 1-2 sets - 10 reps  ASSESSMENT:  CLINICAL IMPRESSION: Kristin Romero presents to skilled PT reporting that she is still having 5/10 pain, but reports that it depends on what she is doing during the day as to her pain level.  Patient is progressing towards and was able to increase strengthening.  Patient with some back pain towards the end of the session, so ended with a low back stretch with physioball.  Patient did report some decreased pain following stretch.  Patient continues to require skilled PT to progress towards goal related activities.  OBJECTIVE IMPAIRMENTS: Abnormal gait, decreased activity tolerance, decreased balance, decreased mobility, difficulty walking, decreased strength, and pain.   ACTIVITY LIMITATIONS: standing, squatting, stairs, transfers, and locomotion level  PARTICIPATION LIMITATIONS: cleaning, laundry, shopping, community activity, yard work, and church  PERSONAL FACTORS: Age, Behavior pattern, Fitness, Past/current experiences, and Time since onset of injury/illness/exacerbation are also affecting patient's functional outcome.   REHAB POTENTIAL: Good  CLINICAL DECISION MAKING: Stable/uncomplicated  EVALUATION COMPLEXITY: Low   GOALS: Goals reviewed with patient? No  SHORT TERM GOALS: Target date: 12/27/2022   Will be compliant with appropriate progressive HEP  Baseline: Goal status: MET  2.  Will complete 5X sit to stand test in 10 seconds to show improved  strength and transfer ability  Baseline:  Goal status: IN PROGRESS  3.  Gait mechanics to have normalized including equal step lengths/stance times, improved BOS, resolution of trendelenburg  Baseline:  Goal status: IN PROGRESS  4.  Pain in R hip to be no more than 4/10 at worst  Baseline:  Goal status: IN PROGRESS    LONG TERM GOALS: Target date: 01/24/2023    MMT to have improved by at least 1 grade in all weak groups  Baseline:  Goal status: INITIAL  2.  Will score at least 54/56 on Berg balance test to show reduced overall fall risk  Baseline:  Goal status: INITIAL  3.  Will be able to perform floor to stand transfers with good technique and balance to assist in return to PLOF  Baseline:  Goal status: INITIAL  4.  Will be able to get up from low surfaces and unsteady surfaces (IE, a low couch) without difficulty in order to show improved mobility and strength  Baseline:  Goal status: INITIAL  5.  Will be compliant with appropriate gym based exercise program vs advanced HEP to maintain functional gains and prevent recurrence of pain  Baseline:  Goal status: INITIAL     PLAN:  PT FREQUENCY: 2x/week  PT DURATION: 8 weeks  PLANNED INTERVENTIONS: Therapeutic exercises, Therapeutic activity, Neuromuscular re-education, Balance training, Gait training, Patient/Family education, Self Care, Stair training, Aquatic Therapy, Dry Needling, Cryotherapy, Moist heat, Taping, Ultrasound, Ionotophoresis 4mg /ml Dexamethasone, Manual therapy, and Re-evaluation  PLAN FOR NEXT SESSION: strength and balance, general conditioning.    Reather Laurence, PT, DPT 01/03/23, 1:29 PM  [facility] [address] [place], [place] [address] Phone # [phone] Fax [phone]

## 2023-01-08 ENCOUNTER — Encounter: Payer: Self-pay | Admitting: Rehabilitative and Restorative Service Providers"

## 2023-01-08 ENCOUNTER — Ambulatory Visit: Payer: Medicare Other | Admitting: Rehabilitative and Restorative Service Providers"

## 2023-01-08 DIAGNOSIS — M6281 Muscle weakness (generalized): Secondary | ICD-10-CM

## 2023-01-08 DIAGNOSIS — R2681 Unsteadiness on feet: Secondary | ICD-10-CM

## 2023-01-08 DIAGNOSIS — R262 Difficulty in walking, not elsewhere classified: Secondary | ICD-10-CM

## 2023-01-08 DIAGNOSIS — M25551 Pain in right hip: Secondary | ICD-10-CM

## 2023-01-08 NOTE — Therapy (Signed)
OUTPATIENT PHYSICAL THERAPY TREATMENT NOTE   Patient Name: Kristin Romero MRN: [id number] DOB:1950/12/02, 72 y.o., female Today's Date: 01/08/2023  END OF SESSION:  PT End of Session - 01/08/23 1241     Visit Number 4    Date for PT Re-Evaluation 01/24/23    Authorization Type M[employer]    Progress Note Due on Visit 10    PT Start Time 1240   Pt arrived late for appointment   PT Stop Time 1310    PT Time Calculation (min) 30 min    Activity Tolerance Patient tolerated treatment well    Behavior During Therapy [facility] for tasks assessed/performed             Past Medical History:  Diagnosis Date   ASCUS favor benign 09/2014   negative high risk HPV recommend repeat Pap smear one year   Hypertension    History reviewed. No pertinent surgical history. 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 for periprosthetic fracture 12/28/2021   Trochanteric bursitis, right hip 12/06/2021   Dislocation of shoulder, right, closed 07/22/2015    PCP: Marden Noble MD   REFERRING PROVIDER: Monica Becton, MD  REFERRING DIAG:  M16.0 (ICD-10-CM) - Primary osteoarthritis of both hips    THERAPY DIAG:  Pain in right hip  Muscle weakness (generalized)  Difficulty in walking, not elsewhere classified  Unsteadiness on feet  Rationale for Evaluation and Treatment: Rehabilitation  ONSET DATE: about a year ago   SUBJECTIVE:   SUBJECTIVE STATEMENT: Pt reports some soreness after last session for the rest of the day, but states that otherwise, she felt good.  Reports some increased pain today stating that she had a busy weekend.  States pain continues to be 5/10.  PERTINENT HISTORY: HTN  PAIN:  PAIN:  Are you having pain? Yes NPRS scale: 5/10 Pain location: right hip to upper leg PAIN TYPE: aching and sharp Pain description: intermittent  Aggravating factors:  walking, prolonged sitting Relieving factors: lying supine   PRECAUTIONS: Fall  WEIGHT BEARING RESTRICTIONS: No  FALLS:  Has patient fallen in last 6 months? Yes. Number of falls 2- one fall at home in bathroom tripping on a rug, second fall at daughter's house in [facility] lost balance going around a corner. (+) FOF.   LIVING ENVIRONMENT: Lives with: lives with their family Lives in: House/apartment Stairs: flight of steps (lives downstairs), 2STE to enter home  Has following equipment at home: Single point cane  OCCUPATION: retired   PLOF: Independent, Independent with basic ADLs, Independent with gait, and Independent with transfers  PATIENT GOALS: be able to walk correctly without a limp, no pain   NEXT MD VISIT: Dr. Karie Schwalbe 01/02/23  OBJECTIVE:   DIAGNOSTIC FINDINGS: EXAM: DG HIP (WITH OR WITHOUT PELVIS) 2-3V RIGHT   COMPARISON:  12/28/2021.   FINDINGS: Pelvis is intact with normal and symmetric sacroiliac joints.   No acute fracture or dislocation. Old healed fracture of right superior and inferior pubic rami noted.   No aggressive osseous lesion.   Visualized sacral arcuate lines are unremarkable.   There are changes of chronic pubic symphisitis.   Right hip arthroplasty noted with multiple cerclage wires. There is near anatomic alignment. No periprosthetic fracture or lucency.   There are moderate to severe degenerative changes of the left hip joint in the form of reduced joint space, subchondral sclerosis / cystic changes and osteophytosis.  No radiopaque foreign bodies.   IMPRESSION: No acute osseous abnormalities.  PATIENT SURVEYS:  Eval:  FOTO 60   COGNITION: Overall cognitive status: Within functional limits for tasks assessed       LOWER EXTREMITY MMT:  MMT Right eval Left eval  Hip flexion 3 4-  Hip extension 3- 3+  Hip abduction 3- 3  Hip adduction    Hip internal rotation    Hip external rotation    Knee flexion 4 4  Knee extension 4+ 4+   Ankle dorsiflexion 5 5  Ankle plantarflexion    Ankle inversion    Ankle eversion    Shoulder flexion  4- 4-  Shoulder ABD  4- 4-  Biceps  4+ 4+  Triceps  4+ 4+   (Blank rows = not tested)    FUNCTIONAL TESTS:  Eval: Berg Balance Scale: 46/56 5x sit to stand: 15.4 seconds no UEs   01/01/2023 5 times sit to stand:  12.99 sec 3 min walk test:  453 ft with pain increasing to 7/10  GAIT: Distance walked: in clinic distances  Assistive device utilized: None Level of assistance: Complete Independence Comments: narrow BOS, limited stance time R LE, + trendelenburg, mildly unsteady   TODAY'S TREATMENT:                                                                                                                               DATE: 01/08/2023 Nustep level 5 x5 min with PT present to discuss status Seated hamstring stretch 2x20 sec Seated piriformis stretch 2x20 sec bilat Sit to/from stand from mat holding 5# kettlebell 2x10 Supine bridge x10, Supine bridge with ball adduction x10, Supine bridge with yellow loop around knees x10 Supine clamshells with yellow loop 2x10 Side stepping with yellow loop 2x10 ft bilat Tandem gait with CGA 3x8 ft Backwards gait with SBA 3x8 ft Seated hamstring curl with red tband 2x10 Rocker board PF/DF x2 min    DATE: 01/03/2023 Nustep level 4 x6 min with PT present to discuss status Seated hamstring stretch 2x20 sec Sit to/from stand from mat holding 5# kettlebell 2x10 Seated LAQ with ball squeeze and 2# 2x10 bilat Seated hip ER with 2# 2x10 bilat Standing with 2# on ankles: hip abduction, hip extension, and marching.  x10 each bilat Standing hip hiking from 2" step x10 bilat Seated piriformis stretch 2x20 sec bilat Seated 3 way blue pball rollout 3x5 sec hold each   DATE: 01/01/2023 Nustep level 4 x6 min with PT present to discuss status Seated hamstring stretch 2x20 sec Sit to/from stand from mat holding 5# kettlebell 2x10 Standing hip  abduction, hip extension, and marching.  x10 each bilat Seated LAQ with ball squeeze and 2# 2x10 bilat Seated hip ER with 2# 2x10 bilat Supine bridge 2x10 Side-lying clamshell with green tband 2x10 bilat 3 min ambulation for 453 ft with pain increasing to 7/10    PATIENT EDUCATION:  Education details: exam findings,  POC, HEP  Person educated: Patient Education method: Explanation, Demonstration, and Handouts Education comprehension: verbalized understanding, returned demonstration, and needs further education  HOME EXERCISE PROGRAM:   Access Code: [email or web address] URL: [email or web address] Date: 01/01/2023 Prepared by: Clydie Braun Kamela Blansett  Exercises - Clam with Resistance  - 1-2 x daily - 7 x weekly - 1 sets - 10 reps - 2 hold - Supine Bridge  - 1 x daily - 7 x weekly - 2 sets - 10 reps - Seated Hip External Rotation AROM  - 1 x daily - 7 x weekly - 2 sets - 10 reps - Tandem Stance in Corner  - 1-2 x daily - 7 x weekly - 1 sets - 6 reps - 15-30 hold - Standing Hip Extension with Counter Support  - 1 x daily - 7 x weekly - 1-2 sets - 10 reps - Standing Hip Abduction with Counter Support  - 1 x daily - 7 x weekly - 1-2 sets - 10 reps - Standing March with Counter Support  - 1 x daily - 7 x weekly - 1-2 sets - 10 reps  ASSESSMENT:  CLINICAL IMPRESSION: Arline Asp presents to skilled PT reporting that she is still having 5/10 pain, but does admit to having a busy weekend.  States that the external rotation exercises do seem to hurt the most.  Patient able to progress through session with some reports of muscle fatigue, especially with side stepping with yellow loop.  Patient does require CGA with tandem gait secondary to decreased balance.  Patient continues to require skilled PT to progress towards goal related activities.  OBJECTIVE IMPAIRMENTS: Abnormal gait, decreased activity tolerance, decreased balance, decreased mobility, difficulty walking, decreased strength, and pain.    ACTIVITY LIMITATIONS: standing, squatting, stairs, transfers, and locomotion level  PARTICIPATION LIMITATIONS: cleaning, laundry, shopping, community activity, yard work, and church  PERSONAL FACTORS: Age, Behavior pattern, Fitness, Past/current experiences, and Time since onset of injury/illness/exacerbation are also affecting patient's functional outcome.   REHAB POTENTIAL: Good  CLINICAL DECISION MAKING: Stable/uncomplicated  EVALUATION COMPLEXITY: Low   GOALS: Goals reviewed with patient? No  SHORT TERM GOALS: Target date: 12/27/2022   Will be compliant with appropriate progressive HEP  Baseline: Goal status: MET  2.  Will complete 5X sit to stand test in 10 seconds to show improved strength and transfer ability  Baseline:  Goal status: IN PROGRESS  3.  Gait mechanics to have normalized including equal step lengths/stance times, improved BOS, resolution of trendelenburg  Baseline:  Goal status: IN PROGRESS  4.  Pain in R hip to be no more than 4/10 at worst  Baseline:  Goal status: IN PROGRESS    LONG TERM GOALS: Target date: 01/24/2023    MMT to have improved by at least 1 grade in all weak groups  Baseline:  Goal status: INITIAL  2.  Will score at least 54/56 on Berg balance test to show reduced overall fall risk  Baseline:  Goal status: INITIAL  3.  Will be able to perform floor to stand transfers with good technique and balance to assist in return to PLOF  Baseline:  Goal status: INITIAL  4.  Will be able to get up from low surfaces and unsteady surfaces (IE, a low couch) without difficulty in order to show improved mobility and strength  Baseline:  Goal status: INITIAL  5.  Will be compliant with appropriate gym based exercise program vs advanced HEP to maintain functional gains and prevent recurrence of pain  Baseline:  Goal status: INITIAL     PLAN:  PT FREQUENCY: 2x/week  PT DURATION: 8 weeks  PLANNED INTERVENTIONS: Therapeutic  exercises, Therapeutic activity, Neuromuscular re-education, Balance training, Gait training, Patient/Family education, Self Care, Stair training, Aquatic Therapy, Dry Needling, Cryotherapy, Moist heat, Taping, Ultrasound, Ionotophoresis 4mg /ml Dexamethasone, Manual therapy, and Re-evaluation  PLAN FOR NEXT SESSION: strength and balance, general conditioning.    Reather Laurence, PT, DPT 01/08/23, 1:18 PM  [facility] [address] [place], [place] [address] Phone # [phone] Fax [phone]

## 2023-01-10 ENCOUNTER — Encounter: Payer: Self-pay | Admitting: Rehabilitative and Restorative Service Providers"

## 2023-01-10 ENCOUNTER — Ambulatory Visit: Payer: Medicare Other | Admitting: Rehabilitative and Restorative Service Providers"

## 2023-01-10 DIAGNOSIS — M25551 Pain in right hip: Secondary | ICD-10-CM

## 2023-01-10 DIAGNOSIS — R262 Difficulty in walking, not elsewhere classified: Secondary | ICD-10-CM

## 2023-01-10 DIAGNOSIS — M6281 Muscle weakness (generalized): Secondary | ICD-10-CM

## 2023-01-10 DIAGNOSIS — R2681 Unsteadiness on feet: Secondary | ICD-10-CM

## 2023-01-10 NOTE — Therapy (Signed)
OUTPATIENT PHYSICAL THERAPY TREATMENT NOTE   Patient Name: Kristin Romero MRN: [id number] DOB:[birth year], 72 y.o., female Today's Date: 01/10/2023  END OF SESSION:  PT End of Session - 01/10/23 1234     Visit Number 5    Date for PT Re-Evaluation 01/24/23    Authorization Type M[employer]    Progress Note Due on Visit 10    PT Start Time 1233    PT Stop Time 1311    PT Time Calculation (min) 38 min    Activity Tolerance Patient tolerated treatment well    Behavior During Therapy [facility] for tasks assessed/performed             Past Medical History:  Diagnosis Date   ASCUS favor benign 09/2014   negative high risk HPV recommend repeat Pap smear one year   Hypertension    History reviewed. No pertinent surgical history. 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  States that she is feeling better, but continues to have difficulty donning/doffing her shoes.  PERTINENT HISTORY: HTN  PAIN:  PAIN:  Are you having pain? Yes NPRS scale: 4/10 Pain location: right hip to upper leg PAIN TYPE: aching and sharp Pain description: intermittent  Aggravating factors: walking, prolonged sitting Relieving factors: lying supine   PRECAUTIONS: Fall  WEIGHT BEARING RESTRICTIONS:  No  FALLS:  Has patient fallen in last 6 months? Yes. Number of falls 2- one fall at home in bathroom tripping on a rug, second fall at daughter's house in [facility] lost balance going around a corner. (+) FOF.   LIVING ENVIRONMENT: Lives with: lives with their family Lives in: House/apartment Stairs: flight of steps (lives downstairs), 2STE to enter home  Has following equipment at home: Single point cane  OCCUPATION: retired   PLOF: Independent, Independent with basic ADLs, Independent with gait, and Independent with transfers  PATIENT GOALS: be able to walk correctly without a limp, no pain   NEXT MD VISIT: Dr. Karie Schwalbe 01/02/23  OBJECTIVE:   DIAGNOSTIC FINDINGS: EXAM: DG HIP (WITH OR WITHOUT PELVIS) 2-3V RIGHT   COMPARISON:  12/28/2021.   FINDINGS: Pelvis is intact with normal and symmetric sacroiliac joints.   No acute fracture or dislocation. Old healed fracture of right superior and inferior pubic rami noted.   No aggressive osseous lesion.   Visualized sacral arcuate lines are unremarkable.   There are changes of chronic pubic symphisitis.   Right hip arthroplasty noted with multiple cerclage wires. There is near anatomic alignment. No periprosthetic fracture or lucency.   There are moderate to severe degenerative changes of the left hip joint in the form of reduced joint space, subchondral sclerosis / cystic changes and osteophytosis.   No radiopaque foreign bodies.   IMPRESSION: No acute osseous abnormalities.  PATIENT SURVEYS:  Eval:  FOTO 60  COGNITION: Overall cognitive status: Within functional limits for tasks assessed       LOWER EXTREMITY MMT:  MMT Right eval Left eval  Hip flexion 3 4-  Hip extension 3- 3+  Hip abduction 3- 3  Hip adduction    Hip internal rotation    Hip external rotation    Knee flexion 4 4  Knee extension 4+ 4+  Ankle dorsiflexion 5 5  Ankle plantarflexion    Ankle inversion    Ankle eversion    Shoulder flexion  4-  4-  Shoulder ABD  4- 4-  Biceps  4+ 4+  Triceps  4+ 4+   (Blank rows = not tested)    FUNCTIONAL TESTS:  Eval: Berg Balance Scale: 46/56 5x sit to stand: 15.4 seconds no UEs   01/01/2023 5 times sit to stand:  12.99 sec 3 min walk test:  453 ft with pain increasing to 7/10  7/37/2024: 5 times sit to stand:  11.32 sec  GAIT: Distance walked: in clinic distances  Assistive device utilized: None Level of assistance: Complete Independence Comments: narrow BOS, limited stance time R LE, + trendelenburg, mildly unsteady   TODAY'S TREATMENT:                                                                                                                               DATE: 01/10/2023 Seated hamstring stretch 3x20 sec bilat Seated piriformis stretch 3x20 sec bilat 5 times sit to stand Nustep level 5 x6 min with PT present to discuss status Seated LAQ with ball squeeze and 2# 2x10 bilat Seated hip ER with 2# 2x10 bilat Ambulation 2x150 ft with 2# ankle weights with seated recovery period in between ambulation trials. Leg Press (seat at 6) 60# 2x10 Seated hamstring curl with red tband 2x10 Seated hip abduction with red tband 2x10   DATE: 01/08/2023 Nustep level 5 x5 min with PT present to discuss status Seated hamstring stretch 2x20 sec Seated piriformis stretch 2x20 sec bilat Sit to/from stand from mat holding 5# kettlebell 2x10 Supine bridge x10, Supine bridge with ball adduction x10, Supine bridge with yellow loop around knees x10 Supine clamshells with yellow loop 2x10 Side stepping with yellow loop 2x10 ft bilat Tandem gait with CGA 3x8 ft Backwards gait with SBA 3x8 ft Seated hamstring curl with red tband 2x10 Rocker board PF/DF x2 min    DATE: 01/03/2023 Nustep level 4 x6 min with PT present to discuss status Seated hamstring stretch 2x20 sec Sit to/from stand from mat holding 5# kettlebell 2x10 Seated LAQ with ball squeeze and 2# 2x10 bilat Seated hip ER with 2#  2x10 bilat Standing with 2# on ankles: hip abduction, hip extension, and marching.  x10 each bilat Standing hip hiking from 2" step x10 bilat Seated piriformis stretch 2x20 sec bilat Seated 3 way blue pball rollout 3x5 sec hold each      PATIENT EDUCATION:  Education details: exam findings, POC, HEP  Person educated: Patient Education method: Explanation, Demonstration, and Handouts Education comprehension: verbalized understanding, returned demonstration, and needs further education  HOME EXERCISE PROGRAM:   Access Code: [email or web address] URL: [email or web address] Date: 01/01/2023 Prepared by: Clydie Braun Tomisha Reppucci  Exercises - Clam with Resistance  - 1-2 x daily - 7 x weekly - 1 sets - 10 reps - 2 hold - Supine Bridge  - 1 x daily - 7 x weekly - 2 sets - 10 reps - Seated Hip External Rotation AROM  - 1 x daily - 7 x weekly - 2 sets - 10 reps - Tandem Stance in Corner  - 1-2 x daily - 7 x weekly - 1 sets - 6 reps - 15-30 hold - Standing Hip Extension with Counter Support  - 1 x daily - 7 x weekly - 1-2 sets - 10 reps - Standing Hip Abduction with Counter Support  - 1 x daily - 7 x weekly - 1-2 sets - 10 reps - Standing March with Counter Support  - 1 x daily - 7 x weekly - 1-2 sets - 10 reps  ASSESSMENT:  CLINICAL IMPRESSION: Arline Asp presents to skilled PT reporting that she is noticing that she is becoming more confident.  Patient reports that she will be going out of town next week to the [employer] and verbalizes her understanding that she needs to continue her HEP.  Patient with improved time noted on her 5 times sit to stand from last assessment.  Patient is progressing well and anticipate that patient would benefit from continued PT.  Will perform reassessment next visit with anticipated continuing of skilled PT.  OBJECTIVE IMPAIRMENTS: Abnormal gait, decreased activity tolerance, decreased balance, decreased mobility, difficulty walking, decreased strength, and pain.    ACTIVITY LIMITATIONS: standing, squatting, stairs, transfers, and locomotion level  PARTICIPATION LIMITATIONS: cleaning, laundry, shopping, community activity, yard work, and church  PERSONAL FACTORS: Age, Behavior pattern, Fitness, Past/current experiences, and Time since onset of injury/illness/exacerbation are also affecting patient's functional outcome.   REHAB POTENTIAL: Good  CLINICAL DECISION MAKING: Stable/uncomplicated  EVALUATION COMPLEXITY: Low   GOALS: Goals reviewed with patient? No  SHORT TERM GOALS: Target date: 12/27/2022   Will be compliant with appropriate progressive HEP  Baseline: Goal status: MET  2.  Will complete 5X sit to stand test in 10 seconds to show improved strength and transfer ability  Baseline:  Goal status: IN PROGRESS  3.  Gait mechanics to have normalized including equal step lengths/stance times, improved BOS, resolution of trendelenburg  Baseline:  Goal status: IN PROGRESS  4.  Pain in R hip to be no more than 4/10 at worst  Baseline:  Goal status: IN PROGRESS    LONG TERM GOALS: Target date: 01/24/2023    MMT to have improved by at least 1 grade in all weak groups  Baseline:  Goal status: INITIAL  2.  Will score at least 54/56 on Berg balance test to show reduced overall fall risk  Baseline:  Goal status: INITIAL  3.  Will be able to perform floor to stand transfers with good technique and balance to assist in return to PLOF  Baseline:  Goal status: INITIAL  4.  Will be able to get up from low surfaces and unsteady surfaces (IE, a low couch) without difficulty in order to show improved mobility and strength  Baseline:  Goal status: INITIAL  5.  Will be compliant with appropriate gym based exercise program vs advanced HEP to maintain functional gains and prevent recurrence of  pain  Baseline:  Goal status: INITIAL     PLAN:  PT FREQUENCY: 2x/week  PT DURATION: 8 weeks  PLANNED INTERVENTIONS: Therapeutic  exercises, Therapeutic activity, Neuromuscular re-education, Balance training, Gait training, Patient/Family education, Self Care, Stair training, Aquatic Therapy, Dry Needling, Cryotherapy, Moist heat, Taping, Ultrasound, Ionotophoresis 4mg /ml Dexamethasone, Manual therapy, and Re-evaluation  PLAN FOR NEXT SESSION: strength and balance, general conditioning.    Reather Laurence, PT, DPT 01/10/23, 1:28 PM  [facility] [address], [address] [place], [place] [address] Phone # [phone] Fax [phone]

## 2023-01-24 ENCOUNTER — Ambulatory Visit: Payer: Medicare Other | Attending: Sports Medicine | Admitting: Rehabilitative and Restorative Service Providers"

## 2023-01-24 ENCOUNTER — Encounter: Payer: Self-pay | Admitting: Rehabilitative and Restorative Service Providers"

## 2023-01-24 DIAGNOSIS — M6281 Muscle weakness (generalized): Secondary | ICD-10-CM | POA: Diagnosis present

## 2023-01-24 DIAGNOSIS — R2681 Unsteadiness on feet: Secondary | ICD-10-CM | POA: Diagnosis present

## 2023-01-24 DIAGNOSIS — R262 Difficulty in walking, not elsewhere classified: Secondary | ICD-10-CM | POA: Insufficient documentation

## 2023-01-24 DIAGNOSIS — M25551 Pain in right hip: Secondary | ICD-10-CM | POA: Diagnosis present

## 2023-01-24 NOTE — Therapy (Signed)
OUTPATIENT PHYSICAL THERAPY TREATMENT NOTE AND REASSESSMENT NOTE   Patient Name: Kristin Romero MRN: [id number] DOB:[birth year], 72 y.o., female Today's Date: 01/24/2023   Progress Note Reporting Period 11/29/2022 to 01/24/2023  See note below for Objective Data and Assessment of Progress/Goals.       END OF SESSION:  PT End of Session - 01/24/23 1028     Visit Number 6    Date for PT Re-Evaluation 03/23/23    Authorization Type M[employer]    Progress Note Due on Visit 16    PT Start Time 1026   Pt arrived late for appointment   PT Stop Time 1055    PT Time Calculation (min) 29 min    Activity Tolerance Patient tolerated treatment well    Behavior During Therapy WFL for tasks assessed/performed             Past Medical History:  Diagnosis Date   ASCUS favor benign 09/2014   negative high risk HPV recommend repeat Pap smear one year   Hypertension    History reviewed. No pertinent surgical history. 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 for periprosthetic fracture 12/28/2021   Trochanteric bursitis, right hip 12/06/2021   Dislocation of shoulder, right, closed 07/22/2015    PCP: Marden Noble MD   REFERRING PROVIDER: Monica Becton, MD  REFERRING DIAG:  M16.0 (ICD-10-CM) - Primary osteoarthritis of both hips    THERAPY DIAG:  Pain in right hip  Muscle weakness (generalized)  Difficulty in walking, not elsewhere classified  Unsteadiness on feet  Rationale for Evaluation and Treatment: Rehabilitation  ONSET DATE: about a year ago   SUBJECTIVE:   SUBJECTIVE STATEMENT: Pt reports that she had some increased pain last night, but is feeling better today.  States that she is unsure when she goes back to see Dr T.  Andrey Cota that she needs to call back to reschedule previously cancelled appointment.  PERTINENT HISTORY: HTN  PAIN:  PAIN:   Are you having pain? Yes NPRS scale: 3/10 Pain location: right hip to upper leg PAIN TYPE: aching and sharp Pain description: intermittent  Aggravating factors: walking, prolonged sitting Relieving factors: lying supine   PRECAUTIONS: Fall  WEIGHT BEARING RESTRICTIONS: No  FALLS:  Has patient fallen in last 6 months? Yes. Number of falls 2- one fall at home in bathroom tripping on a rug, second fall at daughter's house in [facility] lost balance going around a corner. (+) FOF.   LIVING ENVIRONMENT: Lives with: lives with their family Lives in: House/apartment Stairs: flight of steps (lives downstairs), 2STE to enter home  Has following equipment at home: Single point cane  OCCUPATION: retired   PLOF: Independent, Independent with basic ADLs, Independent with gait, and Independent with transfers  PATIENT GOALS: be able to walk correctly without a limp, no pain   NEXT MD VISIT: Dr. Karie Schwalbe 01/02/23  OBJECTIVE:   DIAGNOSTIC FINDINGS:  Right Hip Radiograph on 11/21/2022 DG HIP (WITH OR WITHOUT PELVIS) 2-3V RIGHT IMPRESSION: No acute osseous abnormalities.  PATIENT SURVEYS:  Eval:  FOTO 60 01/24/2023:  FOTO 63 (goal met)   COGNITION: Overall cognitive status: Within functional limits for tasks assessed       LOWER EXTREMITY MMT:  MMT Right eval Left eval  Hip flexion 3 4-  Hip extension 3- 3+  Hip abduction 3- 3  Hip adduction    Hip internal rotation  Hip external rotation    Knee flexion 4 4  Knee extension 4+ 4+  Ankle dorsiflexion 5 5  Ankle plantarflexion    Ankle inversion    Ankle eversion    Shoulder flexion  4- 4-  Shoulder ABD  4- 4-  Biceps  4+ 4+  Triceps  4+ 4+   (Blank rows = not tested)  01/24/2023:   Right hip strength grossly 3+/5 Left hip strength grossly 4-/5  FUNCTIONAL TESTS:  Eval: Berg Balance Scale: 46/56 5x sit to stand: 15.4 seconds no UEs   01/01/2023 5 times sit to stand:  12.99 sec 3 min walk test:  453 ft with pain increasing  to 7/10  7/37/2024: 5 times sit to stand:  11.32 sec  01/24/2023: 5 times sit to stand: 9.74 sec BERG 52/56 3 min walk test:  571 ft with no increase in pain  GAIT: Distance walked: in clinic distances  Assistive device utilized: None Level of assistance: Complete Independence Comments: narrow BOS, limited stance time R LE, + trendelenburg, mildly unsteady   TODAY'S TREATMENT:                                                                                                                               DATE: 01/24/2023 Nustep level 4 x5 min with PT present to discuss status FOTO Seated hamstring stretch 2x20 sec bilat BERG 52/56 5 min ambulation in PT clinic Seated LAQ with ball squeeze and 2.5# 2x10 bilat (with cuing to maintain quad TKE contraction for 2 sec)   DATE: 01/10/2023 Seated hamstring stretch 3x20 sec bilat Seated piriformis stretch 3x20 sec bilat 5 times sit to stand Nustep level 5 x6 min with PT present to discuss status Seated LAQ with ball squeeze and 2# 2x10 bilat Seated hip ER with 2# 2x10 bilat Ambulation 2x150 ft with 2# ankle weights with seated recovery period in between ambulation trials. Leg Press (seat at 6) 60# 2x10 Seated hamstring curl with red tband 2x10 Seated hip abduction with red tband 2x10   DATE: 01/08/2023 Nustep level 5 x5 min with PT present to discuss status Seated hamstring stretch 2x20 sec Seated piriformis stretch 2x20 sec bilat Sit to/from stand from mat holding 5# kettlebell 2x10 Supine bridge x10, Supine bridge with ball adduction x10, Supine bridge with yellow loop around knees x10 Supine clamshells with yellow loop 2x10 Side stepping with yellow loop 2x10 ft bilat Tandem gait with CGA 3x8 ft Backwards gait with SBA 3x8 ft Seated hamstring curl with red tband 2x10 Rocker board PF/DF x2 min     PATIENT EDUCATION:  Education details: exam findings, POC, HEP  Person educated: Patient Education method: Programmer, multimedia,  Demonstration, and Handouts Education comprehension: verbalized understanding, returned demonstration, and needs further education  HOME EXERCISE PROGRAM:   Access Code: [email or web address] URL: [email or web address] Date: 01/01/2023 Prepared by: Clydie Braun Loden Laurent  Exercises - Clam with Resistance  - 1-2 x daily - 7 x weekly - 1  sets - 10 reps - 2 hold - Supine Bridge  - 1 x daily - 7 x weekly - 2 sets - 10 reps - Seated Hip External Rotation AROM  - 1 x daily - 7 x weekly - 2 sets - 10 reps - Tandem Stance in Corner  - 1-2 x daily - 7 x weekly - 1 sets - 6 reps - 15-30 hold - Standing Hip Extension with Counter Support  - 1 x daily - 7 x weekly - 1-2 sets - 10 reps - Standing Hip Abduction with Counter Support  - 1 x daily - 7 x weekly - 1-2 sets - 10 reps - Standing March with Counter Support  - 1 x daily - 7 x weekly - 1-2 sets - 10 reps  ASSESSMENT:  CLINICAL IMPRESSION: Arline Asp presents to skilled PT after being out of town to go to [employer] reporting that overall, she is getting stronger.  Patient has increased FOTO score and met that goal.  Pt with improved time noted on 5 times sit to stand and with improved BERG balance score.  Patient able to perform 3 min ambulation without increase in pain today.  Patient has made some progress towards strength, but has not yet met functional strength goal.  Patient continues to have difficulty performing sit to/from stand from low surfaces and has been unable to perform a floor to standing transfer.  Patient would continue to benefit from skilled PT 1-2x/week for 8 weeks to further progress towards improved functional strength and mobility with decreased pain.  OBJECTIVE IMPAIRMENTS: Abnormal gait, decreased activity tolerance, decreased balance, decreased mobility, difficulty walking, decreased strength, and pain.   ACTIVITY LIMITATIONS: standing, squatting, stairs, transfers, and locomotion level  PARTICIPATION LIMITATIONS: cleaning,  laundry, shopping, community activity, yard work, and church  PERSONAL FACTORS: Age, Behavior pattern, Fitness, Past/current experiences, and Time since onset of injury/illness/exacerbation are also affecting patient's functional outcome.   REHAB POTENTIAL: Good  CLINICAL DECISION MAKING: Stable/uncomplicated  EVALUATION COMPLEXITY: Low   GOALS: Goals reviewed with patient? Yes  SHORT TERM GOALS: Target date: 12/27/2022   Will be compliant with appropriate progressive HEP  Baseline: Goal status: MET  2.  Will complete 5X sit to stand test in 10 seconds to show improved strength and transfer ability  Baseline:  Goal status: MET  4.  Pain in R hip to be no more than 4/10 at worst  Baseline:  Goal status: MET    LONG TERM GOALS: Target date: 03/23/2023    MMT to have improved by at least 1 grade in all weak groups  Baseline:  Goal status: IN PROGRESS  2.  Will score at least 54/56 on Berg balance test to show reduced overall fall risk  Baseline:  Goal status: IN PROGRESS (see above)  3.  Will be able to perform floor to stand transfers with good technique and balance to assist in return to PLOF  Baseline: reports unable on 01/24/2023 Goal status: IN PROGRESS  4.  Will be able to get up from low surfaces and unsteady surfaces (IE, a low couch) without difficulty in order to show improved mobility and strength  Baseline: reports still difficult on 01/24/23 Goal status: IN PROGRESS  5.  Will be compliant with appropriate gym based exercise program vs advanced HEP to maintain functional gains and prevent recurrence of pain  Baseline:  Goal status: IN PROGRESS     PLAN:  PT FREQUENCY: 2x/week  PT DURATION: 8 weeks  PLANNED INTERVENTIONS:  Therapeutic exercises, Therapeutic activity, Neuromuscular re-education, Balance training, Gait training, Patient/Family education, Self Care, Joint mobilization, Joint manipulation, Stair training, Aquatic Therapy, Dry Needling,  Electrical stimulation, Spinal manipulation, Spinal mobilization, Cryotherapy, Moist heat, Taping, Traction, Ultrasound, Ionotophoresis 4mg /ml Dexamethasone, Manual therapy, and Re-evaluation  PLAN FOR NEXT SESSION: strength and balance, general conditioning.    Reather Laurence, PT, DPT 01/24/23, 10:30 AM  [facility] [address], [address] [place], [place] [address] Phone # [phone] Fax [phone]

## 2023-02-07 ENCOUNTER — Encounter: Payer: Self-pay | Admitting: Rehabilitative and Restorative Service Providers"

## 2023-02-07 ENCOUNTER — Ambulatory Visit: Payer: Medicare Other | Admitting: Rehabilitative and Restorative Service Providers"

## 2023-02-07 DIAGNOSIS — R2681 Unsteadiness on feet: Secondary | ICD-10-CM

## 2023-02-07 DIAGNOSIS — R262 Difficulty in walking, not elsewhere classified: Secondary | ICD-10-CM

## 2023-02-07 DIAGNOSIS — M25551 Pain in right hip: Secondary | ICD-10-CM

## 2023-02-07 DIAGNOSIS — M6281 Muscle weakness (generalized): Secondary | ICD-10-CM

## 2023-02-07 NOTE — Therapy (Signed)
OUTPATIENT PHYSICAL THERAPY TREATMENT NOTE   Patient Name: RAMYA LINGG MRN: [id number] DOB:08/24/1950, 72 y.o., female Today's Date: 02/07/2023   END OF SESSION:  PT End of Session - 02/07/23 1025     Visit Number 7    Date for PT Re-Evaluation 03/23/23    Authorization Type M[employer]    Progress Note Due on Visit 16    PT Start Time 1023   Pt arrived late   PT Stop Time 1101    PT Time Calculation (min) 38 min    Activity Tolerance Patient tolerated treatment well    Behavior During Therapy WFL for tasks assessed/performed             Past Medical History:  Diagnosis Date   ASCUS favor benign 09/2014   negative high risk HPV recommend repeat Pap smear one year   Hypertension    History reviewed. No pertinent surgical history. 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 for periprosthetic fracture 12/28/2021   Trochanteric bursitis, right hip 12/06/2021   Dislocation of shoulder, right, closed 07/22/2015    PCP: Marden Noble MD   REFERRING PROVIDER: Monica Becton, MD  REFERRING DIAG:  M16.0 (ICD-10-CM) - Primary osteoarthritis of both hips    THERAPY DIAG:  Pain in right hip  Muscle weakness (generalized)  Difficulty in walking, not elsewhere classified  Unsteadiness on feet  Rationale for Evaluation and Treatment: Rehabilitation  ONSET DATE: about a year ago   SUBJECTIVE:   SUBJECTIVE STATEMENT: Pt reports that she will call Dr T to schedule a follow up appointment later today.  Patient reports that she did a lot of walking [place] the [place] and her pain is only 2/10 this AM.  PERTINENT HISTORY: HTN  PAIN:  PAIN:  Are you having pain? Yes NPRS scale: 2/10 Pain location: right hip to upper leg PAIN TYPE: aching and sharp Pain description: intermittent  Aggravating factors: walking, prolonged sitting Relieving  factors: lying supine   PRECAUTIONS: Fall  WEIGHT BEARING RESTRICTIONS: No  FALLS:  Has patient fallen in last 6 months? Yes. Number of falls 2- one fall at home in bathroom tripping on a rug, second fall at daughter's house in [facility] lost balance going around a corner. (+) FOF.   LIVING ENVIRONMENT: Lives with: lives with their family Lives in: House/apartment Stairs: flight of steps (lives downstairs), 2STE to enter home  Has following equipment at home: Single point cane  OCCUPATION: retired   PLOF: Independent, Independent with basic ADLs, Independent with gait, and Independent with transfers  PATIENT GOALS: be able to walk correctly without a limp, no pain   NEXT MD VISIT: Follow-up to be scheduled with Dr Benjamin Stain  OBJECTIVE:   DIAGNOSTIC FINDINGS:  Right Hip Radiograph on 11/21/2022 DG HIP (WITH OR WITHOUT PELVIS) 2-3V RIGHT IMPRESSION: No acute osseous abnormalities.  PATIENT SURVEYS:  Eval:  FOTO 60 01/24/2023:  FOTO 63 (goal met)   COGNITION: Overall cognitive status: Within functional limits for tasks assessed       LOWER EXTREMITY MMT:  MMT Right eval Left eval  Hip flexion 3 4-  Hip extension 3- 3+  Hip abduction 3- 3  Hip adduction    Hip internal rotation    Hip external rotation    Knee flexion 4 4  Knee extension 4+ 4+  Ankle dorsiflexion 5 5  Ankle plantarflexion  Ankle inversion    Ankle eversion    Shoulder flexion  4- 4-  Shoulder ABD  4- 4-  Biceps  4+ 4+  Triceps  4+ 4+   (Blank rows = not tested)  01/24/2023:   Right hip strength grossly 3+/5 Left hip strength grossly 4-/5  FUNCTIONAL TESTS:  Eval: Berg Balance Scale: 46/56 5x sit to stand: 15.4 seconds no UEs   01/01/2023 5 times sit to stand:  12.99 sec 3 min walk test:  453 ft with pain increasing to 7/10  7/37/2024: 5 times sit to stand:  11.32 sec  01/24/2023: 5 times sit to stand: 9.74 sec BERG 52/56 3 min walk test:  571 ft with no increase in  pain  GAIT: Distance walked: in clinic distances  Assistive device utilized: None Level of assistance: Complete Independence Comments: narrow BOS, limited stance time R LE, + trendelenburg, mildly unsteady   TODAY'S TREATMENT:                                                                                                                               DATE: 01/24/2023 Nustep level 5 x6 min with PT present to discuss status Seated hamstring stretch 2x20 sec bilat Seated piriformis stretch 2x20 sec bilat Seated LAQ with ball squeeze and 3# 2x10 bilat (with cuing to maintain quad TKE contraction for 2 sec) Ambulation 2x150 ft with 3# ankle weights with seated recovery period in between ambulation trials Seated hip ER with 2# 2x10 bilat Standing "L" counter stretch 2x20 sec Leg Press (seat at 6) 70# 2x10 Rocker board PF/DF x2 min  FWD step ups to 6" step with UE assist x10 bilat   DATE: 01/24/2023 Nustep level 4 x5 min with PT present to discuss status FOTO Seated hamstring stretch 2x20 sec bilat BERG 52/56 5 min ambulation in PT clinic Seated LAQ with ball squeeze and 2.5# 2x10 bilat (with cuing to maintain quad TKE contraction for 2 sec) Tandem gait at barre with occasional UE support as needed 4x8 ft Rocker board PF/DF x2 min    DATE: 01/10/2023 Seated hamstring stretch 3x20 sec bilat Seated piriformis stretch 3x20 sec bilat 5 times sit to stand Nustep level 5 x6 min with PT present to discuss status Seated LAQ with ball squeeze and 2# 2x10 bilat Seated hip ER with 2# 2x10 bilat Ambulation 2x150 ft with 2# ankle weights with seated recovery period in between ambulation trials. Leg Press (seat at 6) 60# 2x10 Seated hamstring curl with red tband 2x10 Seated hip abduction with red tband 2x10    PATIENT EDUCATION:  Education details: exam findings, POC, HEP  Person educated: Patient Education method: Explanation, Demonstration, and Handouts Education comprehension:  verbalized understanding, returned demonstration, and needs further education  HOME EXERCISE PROGRAM:   Access Code: [email or web address] URL: [email or web address] Date: 01/01/2023 Prepared by: Clydie Braun Khayree Delellis  Exercises - Clam with Resistance  - 1-2 x daily - 7 x weekly - 1 sets -  10 reps - 2 hold - Supine Bridge  - 1 x daily - 7 x weekly - 2 sets - 10 reps - Seated Hip External Rotation AROM  - 1 x daily - 7 x weekly - 2 sets - 10 reps - Tandem Stance in Corner  - 1-2 x daily - 7 x weekly - 1 sets - 6 reps - 15-30 hold - Standing Hip Extension with Counter Support  - 1 x daily - 7 x weekly - 1-2 sets - 10 reps - Standing Hip Abduction with Counter Support  - 1 x daily - 7 x weekly - 1-2 sets - 10 reps - Standing March with Counter Support  - 1 x daily - 7 x weekly - 1-2 sets - 10 reps  ASSESSMENT:  CLINICAL IMPRESSION: Arline Asp presents to skilled PT after being out of town at [employer], but is making continued progress towards goals.  Patient is progressing with increased strength, as evidenced by her ability to perform exercises with increased weights.  Patient continues with low level hip pain.  Patient is making progress towards goals and able to progress with step up exercises today.  OBJECTIVE IMPAIRMENTS: Abnormal gait, decreased activity tolerance, decreased balance, decreased mobility, difficulty walking, decreased strength, and pain.   ACTIVITY LIMITATIONS: standing, squatting, stairs, transfers, and locomotion level  PARTICIPATION LIMITATIONS: cleaning, laundry, shopping, community activity, yard work, and church  PERSONAL FACTORS: Age, Behavior pattern, Fitness, Past/current experiences, and Time since onset of injury/illness/exacerbation are also affecting patient's functional outcome.   REHAB POTENTIAL: Good  CLINICAL DECISION MAKING: Stable/uncomplicated  EVALUATION COMPLEXITY: Low   GOALS: Goals reviewed with patient? Yes  SHORT TERM GOALS: Target date:  12/27/2022   Will be compliant with appropriate progressive HEP  Baseline: Goal status: MET  2.  Will complete 5X sit to stand test in 10 seconds to show improved strength and transfer ability  Baseline:  Goal status: MET  4.  Pain in R hip to be no more than 4/10 at worst  Baseline:  Goal status: MET    LONG TERM GOALS: Target date: 03/23/2023    MMT to have improved by at least 1 grade in all weak groups  Baseline:  Goal status: IN PROGRESS  2.  Will score at least 54/56 on Berg balance test to show reduced overall fall risk  Baseline:  Goal status: IN PROGRESS (see above)  3.  Will be able to perform floor to stand transfers with good technique and balance to assist in return to PLOF  Baseline: reports unable on 01/24/2023 Goal status: IN PROGRESS  4.  Will be able to get up from low surfaces and unsteady surfaces (IE, a low couch) without difficulty in order to show improved mobility and strength  Baseline: reports still difficult on 01/24/23 Goal status: IN PROGRESS  5.  Will be compliant with appropriate gym based exercise program vs advanced HEP to maintain functional gains and prevent recurrence of pain  Baseline:  Goal status: IN PROGRESS     PLAN:  PT FREQUENCY: 2x/week  PT DURATION: 8 weeks  PLANNED INTERVENTIONS: Therapeutic exercises, Therapeutic activity, Neuromuscular re-education, Balance training, Gait training, Patient/Family education, Self Care, Joint mobilization, Joint manipulation, Stair training, Aquatic Therapy, Dry Needling, Electrical stimulation, Spinal manipulation, Spinal mobilization, Cryotherapy, Moist heat, Taping, Traction, Ultrasound, Ionotophoresis 4mg /ml Dexamethasone, Manual therapy, and Re-evaluation  PLAN FOR NEXT SESSION: strength and balance, general conditioning.    Reather Laurence, PT, DPT 02/07/23, 11:41 AM  [facility]  [address] [place], [place] [address] Phone #  [phone] Fax [phone]

## 2023-02-14 ENCOUNTER — Encounter: Payer: Self-pay | Admitting: Rehabilitative and Restorative Service Providers"

## 2023-02-14 ENCOUNTER — Ambulatory Visit: Payer: Medicare Other | Attending: Sports Medicine | Admitting: Rehabilitative and Restorative Service Providers"

## 2023-02-14 DIAGNOSIS — R2681 Unsteadiness on feet: Secondary | ICD-10-CM | POA: Diagnosis present

## 2023-02-14 DIAGNOSIS — M25551 Pain in right hip: Secondary | ICD-10-CM | POA: Diagnosis present

## 2023-02-14 DIAGNOSIS — R262 Difficulty in walking, not elsewhere classified: Secondary | ICD-10-CM | POA: Diagnosis present

## 2023-02-14 DIAGNOSIS — M6281 Muscle weakness (generalized): Secondary | ICD-10-CM | POA: Insufficient documentation

## 2023-02-14 NOTE — Therapy (Signed)
OUTPATIENT PHYSICAL THERAPY TREATMENT NOTE   Patient Name: Kristin Romero MRN: [id number] DOB:12-06-1950, 72 y.o., female Today's Date: 02/14/2023   END OF SESSION:  PT End of Session - 02/14/23 1241     Visit Number 8    Date for PT Re-Evaluation 03/23/23    Authorization Type [employer]    Progress Note Due on Visit 16    PT Start Time 1239   Pt arrived late for appointment   PT Stop Time 1310    PT Time Calculation (min) 31 min    Activity Tolerance Patient tolerated treatment well    Behavior During Therapy [facility] for tasks assessed/performed             Past Medical History:  Diagnosis Date   ASCUS favor benign 09/2014   negative high risk HPV recommend repeat Pap smear one year   Hypertension    History reviewed. No pertinent surgical history. 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 for periprosthetic fracture 12/28/2021   Trochanteric bursitis, right hip 12/06/2021   Dislocation of shoulder, right, closed 07/22/2015    PCP: Marden Noble MD   REFERRING PROVIDER: Monica Becton, MD  REFERRING DIAG:  M16.0 (ICD-10-CM) - Primary osteoarthritis of both hips    THERAPY DIAG:  Pain in right hip  Muscle weakness (generalized)  Difficulty in walking, not elsewhere classified  Unsteadiness on feet  Rationale for Evaluation and Treatment: Rehabilitation  ONSET DATE: about a year ago   SUBJECTIVE:   SUBJECTIVE STATEMENT: Pt reports that she has tried to call Dr T to schedule, but has not been able to get through.  States that she had some increased pain over the weekend when the thunderstorms came through, but is feeling better today.  PERTINENT HISTORY: HTN  PAIN:  PAIN:  Are you having pain? Yes NPRS scale: 1/10 Pain location: right hip to upper leg PAIN TYPE: aching and sharp Pain description: intermittent  Aggravating factors:  walking, prolonged sitting Relieving factors: lying supine   PRECAUTIONS: Fall  WEIGHT BEARING RESTRICTIONS: No  FALLS:  Has patient fallen in last 6 months? Yes. Number of falls 2- one fall at home in bathroom tripping on a rug, second fall at daughter's house in [facility] lost balance going around a corner. (+) FOF.   LIVING ENVIRONMENT: Lives with: lives with their family Lives in: House/apartment Stairs: flight of steps (lives downstairs), 2STE to enter home  Has following equipment at home: Single point cane  OCCUPATION: retired   PLOF: Independent, Independent with basic ADLs, Independent with gait, and Independent with transfers  PATIENT GOALS: be able to walk correctly without a limp, no pain   NEXT MD VISIT: Follow-up to be scheduled with Dr Benjamin Stain  OBJECTIVE:   DIAGNOSTIC FINDINGS:  Right Hip Radiograph on 11/21/2022 DG HIP (WITH OR WITHOUT PELVIS) 2-3V RIGHT IMPRESSION: No acute osseous abnormalities.  PATIENT SURVEYS:  Eval:  FOTO 60 01/24/2023:  FOTO 63 (goal met)   COGNITION: Overall cognitive status: Within functional limits for tasks assessed       LOWER EXTREMITY MMT:  MMT Right eval Left eval  Hip flexion 3 4-  Hip extension 3- 3+  Hip abduction 3- 3  Hip adduction    Hip internal rotation    Hip external rotation    Knee flexion 4 4  Knee extension 4+ 4+  Ankle dorsiflexion 5 5  Ankle plantarflexion  Ankle inversion    Ankle eversion    Shoulder flexion  4- 4-  Shoulder ABD  4- 4-  Biceps  4+ 4+  Triceps  4+ 4+   (Blank rows = not tested)  01/24/2023:   Right hip strength grossly 3+/5 Left hip strength grossly 4-/5  FUNCTIONAL TESTS:  Eval: Berg Balance Scale: 46/56 5x sit to stand: 15.4 seconds no UEs   01/01/2023 5 times sit to stand:  12.99 sec 3 min walk test:  453 ft with pain increasing to 7/10  7/37/2024: 5 times sit to stand:  11.32 sec  01/24/2023: 5 times sit to stand: 9.74 sec BERG 52/56 3 min walk test:   571 ft with no increase in pain  GAIT: Distance walked: in clinic distances  Assistive device utilized: None Level of assistance: Complete Independence Comments: narrow BOS, limited stance time R LE, + trendelenburg, mildly unsteady   TODAY'S TREATMENT:                                                                                                                               DATE: 01/24/2023 Nustep level 5 x5 min with PT present to discuss status Seated hamstring stretch 2x20 sec bilat Seated piriformis stretch 2x20 sec bilat Seated LAQ with ball squeeze and 3# 2x10 bilat (with cuing to maintain quad TKE contraction for 2 sec) FWD step ups on 6" step 2x10 bilat with cuing to decrease reliance on UE Lateral step ups on 6" step 2x10 bilat with cuing to decrease reliance on UE Standing at barre on foam pad:  hip abduction and hip extension.  2x10 each bilat Rocker board PF/DF x2 min    DATE: 01/24/2023 Nustep level 5 x6 min with PT present to discuss status Seated hamstring stretch 2x20 sec bilat Seated piriformis stretch 2x20 sec bilat Seated LAQ with ball squeeze and 3# 2x10 bilat (with cuing to maintain quad TKE contraction for 2 sec) Ambulation 2x150 ft with 3# ankle weights with seated recovery period in between ambulation trials Seated hip ER with 2# 2x10 bilat Standing "L" counter stretch 2x20 sec Leg Press (seat at 6) 70# 2x10 Rocker board PF/DF x2 min  FWD step ups to 6" step with UE assist x10 bilat   DATE: 01/24/2023 Nustep level 4 x5 min with PT present to discuss status FOTO Seated hamstring stretch 2x20 sec bilat BERG 52/56 5 min ambulation in PT clinic Seated LAQ with ball squeeze and 2.5# 2x10 bilat (with cuing to maintain quad TKE contraction for 2 sec) Tandem gait at barre with occasional UE support as needed 4x8 ft Rocker board PF/DF x2 min     PATIENT EDUCATION:  Education details: exam findings, POC, HEP  Person educated: Patient Education method:  Explanation, Demonstration, and Handouts Education comprehension: verbalized understanding, returned demonstration, and needs further education  HOME EXERCISE PROGRAM:   Access Code: [email or web address] URL: [email or web address] Date: 01/01/2023 Prepared by: Reather Laurence  Exercises - Clam  with Resistance  - 1-2 x daily - 7 x weekly - 1 sets - 10 reps - 2 hold - Supine Bridge  - 1 x daily - 7 x weekly - 2 sets - 10 reps - Seated Hip External Rotation AROM  - 1 x daily - 7 x weekly - 2 sets - 10 reps - Tandem Stance in Corner  - 1-2 x daily - 7 x weekly - 1 sets - 6 reps - 15-30 hold - Standing Hip Extension with Counter Support  - 1 x daily - 7 x weekly - 1-2 sets - 10 reps - Standing Hip Abduction with Counter Support  - 1 x daily - 7 x weekly - 1-2 sets - 10 reps - Standing March with Counter Support  - 1 x daily - 7 x weekly - 1-2 sets - 10 reps  ASSESSMENT:  CLINICAL IMPRESSION: Arline Asp presents to skilled PT reporting that she feels that she has made 70-80% improvements since starting skilled PT.  Pt states that she can get in and out of the shower more easily now.  Patient able to progress to more standing exercises during session today.  Pt reports that she is still having some difficulty with stairs at home, so added step up exercises to session to help with functional strengthening and confidence.  Patient continues to require skilled PT to progress towards goal related activities.  OBJECTIVE IMPAIRMENTS: Abnormal gait, decreased activity tolerance, decreased balance, decreased mobility, difficulty walking, decreased strength, and pain.   ACTIVITY LIMITATIONS: standing, squatting, stairs, transfers, and locomotion level  PARTICIPATION LIMITATIONS: cleaning, laundry, shopping, community activity, yard work, and church  PERSONAL FACTORS: Age, Behavior pattern, Fitness, Past/current experiences, and Time since onset of injury/illness/exacerbation are also affecting patient's  functional outcome.   REHAB POTENTIAL: Good  CLINICAL DECISION MAKING: Stable/uncomplicated  EVALUATION COMPLEXITY: Low   GOALS: Goals reviewed with patient? Yes  SHORT TERM GOALS: Target date: 12/27/2022   Will be compliant with appropriate progressive HEP  Baseline: Goal status: MET  2.  Will complete 5X sit to stand test in 10 seconds to show improved strength and transfer ability  Baseline:  Goal status: MET  4.  Pain in R hip to be no more than 4/10 at worst  Baseline:  Goal status: MET    LONG TERM GOALS: Target date: 03/23/2023    MMT to have improved by at least 1 grade in all weak groups  Baseline:  Goal status: IN PROGRESS  2.  Will score at least 54/56 on Berg balance test to show reduced overall fall risk  Baseline:  Goal status: IN PROGRESS (see above)  3.  Will be able to perform floor to stand transfers with good technique and balance to assist in return to PLOF  Baseline: reports unable on 01/24/2023 Goal status: IN PROGRESS  4.  Will be able to get up from low surfaces and unsteady surfaces (IE, a low couch) without difficulty in order to show improved mobility and strength  Baseline: reports still difficult on 01/24/23 Goal status: IN PROGRESS  5.  Will be compliant with appropriate gym based exercise program vs advanced HEP to maintain functional gains and prevent recurrence of pain  Baseline:  Goal status: IN PROGRESS     PLAN:  PT FREQUENCY: 2x/week  PT DURATION: 8 weeks  PLANNED INTERVENTIONS: Therapeutic exercises, Therapeutic activity, Neuromuscular re-education, Balance training, Gait training, Patient/Family education, Self Care, Joint mobilization, Joint manipulation, Stair training, Aquatic Therapy, Dry Needling, Electrical stimulation, Spinal  manipulation, Spinal mobilization, Cryotherapy, Moist heat, Taping, Traction, Ultrasound, Ionotophoresis 4mg /ml Dexamethasone, Manual therapy, and Re-evaluation  PLAN FOR NEXT SESSION:  strength and balance, general conditioning.    Reather Laurence, PT, DPT 02/14/23, 1:12 PM  [facility] [address] [place], [place] [address] Phone # [phone] Fax [phone]

## 2023-02-19 ENCOUNTER — Ambulatory Visit: Payer: Medicare Other | Admitting: Rehabilitative and Restorative Service Providers"

## 2023-02-22 ENCOUNTER — Ambulatory Visit: Payer: Medicare Other | Admitting: Rehabilitative and Restorative Service Providers"

## 2023-02-26 ENCOUNTER — Ambulatory Visit: Payer: Medicare Other | Admitting: Physical Therapy

## 2023-02-27 ENCOUNTER — Ambulatory Visit: Payer: Medicare Other | Admitting: Sports Medicine

## 2023-02-28 ENCOUNTER — Encounter: Payer: Self-pay | Admitting: Physical Therapy

## 2023-02-28 ENCOUNTER — Ambulatory Visit: Payer: Medicare Other | Admitting: Physical Therapy

## 2023-02-28 DIAGNOSIS — M25551 Pain in right hip: Secondary | ICD-10-CM | POA: Diagnosis not present

## 2023-02-28 DIAGNOSIS — R262 Difficulty in walking, not elsewhere classified: Secondary | ICD-10-CM

## 2023-02-28 DIAGNOSIS — M6281 Muscle weakness (generalized): Secondary | ICD-10-CM

## 2023-02-28 DIAGNOSIS — R2681 Unsteadiness on feet: Secondary | ICD-10-CM

## 2023-03-05 ENCOUNTER — Ambulatory Visit: Payer: Medicare Other | Admitting: Rehabilitative and Restorative Service Providers"

## 2023-03-07 ENCOUNTER — Encounter: Payer: Medicare Other | Admitting: Rehabilitative and Restorative Service Providers"

## 2023-03-12 ENCOUNTER — Ambulatory Visit: Payer: Medicare Other | Admitting: Rehabilitative and Restorative Service Providers"

## 2023-03-14 ENCOUNTER — Ambulatory Visit: Payer: Medicare Other | Attending: Sports Medicine | Admitting: Rehabilitative and Restorative Service Providers"

## 2023-03-19 ENCOUNTER — Ambulatory Visit: Payer: Medicare Other | Admitting: Rehabilitative and Restorative Service Providers"

## 2023-03-21 ENCOUNTER — Ambulatory Visit: Payer: Medicare Other | Admitting: Rehabilitative and Restorative Service Providers"

## 2023-05-24 ENCOUNTER — Ambulatory Visit: Payer: Medicare Other | Admitting: Sports Medicine

## 2023-05-31 ENCOUNTER — Ambulatory Visit: Payer: Medicare Other | Admitting: Sports Medicine

## 2023-07-10 ENCOUNTER — Ambulatory Visit: Payer: Medicare Other | Admitting: Sports Medicine

## 2024-02-12 ENCOUNTER — Encounter: Payer: Self-pay | Admitting: Sports Medicine
